# Patient Record
Sex: Female | Born: 1980 | Race: White | Hispanic: No | Marital: Married | Smoking: Never smoker
Health system: Southern US, Community
[De-identification: ages and names within clinical notes are randomized; demographics above are authoritative.]

## PROBLEM LIST (undated history)

## (undated) DIAGNOSIS — E785 Hyperlipidemia, unspecified: Secondary | ICD-10-CM

## (undated) DIAGNOSIS — K219 Gastro-esophageal reflux disease without esophagitis: Secondary | ICD-10-CM

## (undated) DIAGNOSIS — F32A Depression, unspecified: Secondary | ICD-10-CM

## (undated) DIAGNOSIS — R011 Cardiac murmur, unspecified: Secondary | ICD-10-CM

## (undated) DIAGNOSIS — T7840XA Allergy, unspecified, initial encounter: Secondary | ICD-10-CM

## (undated) DIAGNOSIS — E079 Disorder of thyroid, unspecified: Secondary | ICD-10-CM

## (undated) DIAGNOSIS — R5382 Chronic fatigue, unspecified: Secondary | ICD-10-CM

## (undated) DIAGNOSIS — F419 Anxiety disorder, unspecified: Secondary | ICD-10-CM

## (undated) DIAGNOSIS — F329 Major depressive disorder, single episode, unspecified: Secondary | ICD-10-CM

## (undated) HISTORY — DX: Disorder of thyroid, unspecified: E07.9

## (undated) HISTORY — DX: Allergy, unspecified, initial encounter: T78.40XA

## (undated) HISTORY — DX: Chronic fatigue, unspecified: R53.82

## (undated) HISTORY — DX: Cardiac murmur, unspecified: R01.1

## (undated) HISTORY — DX: Hyperlipidemia, unspecified: E78.5

## (undated) HISTORY — DX: Depression, unspecified: F32.A

## (undated) HISTORY — DX: Major depressive disorder, single episode, unspecified: F32.9

## (undated) HISTORY — DX: Anxiety disorder, unspecified: F41.9

---

## 2003-10-26 HISTORY — PX: TONSILLECTOMY AND ADENOIDECTOMY: SHX28

## 2013-01-11 ENCOUNTER — Telehealth: Payer: Self-pay | Admitting: Obstetrics and Gynecology

## 2013-01-11 MED ORDER — NORETHIN ACE-ETH ESTRAD-FE 1.5-30 MG-MCG PO TABS: 1.0000 | ORAL_TABLET | Freq: Every day | ORAL | Status: DC

## 2013-01-11 NOTE — Telephone Encounter (Signed)
BC REFILL SENT TO [facility], [place]. WITH NOTE FOR PATE INT TO MAKE ANNUAL GYN APPT. WITH DR. Tresa Res.

## 2013-01-23 ENCOUNTER — Other Ambulatory Visit: Payer: Self-pay | Admitting: Obstetrics and Gynecology

## 2013-01-23 NOTE — Telephone Encounter (Signed)
Pt has an annual exam scheduled for 02/12/2013 no refill needed At this time

## 2013-02-08 ENCOUNTER — Ambulatory Visit: Payer: Self-pay | Admitting: Nurse Practitioner

## 2013-02-12 ENCOUNTER — Encounter: Payer: Self-pay | Admitting: Obstetrics and Gynecology

## 2013-02-12 ENCOUNTER — Ambulatory Visit (INDEPENDENT_AMBULATORY_CARE_PROVIDER_SITE_OTHER): Payer: BC Managed Care – PPO | Admitting: Obstetrics and Gynecology

## 2013-02-12 ENCOUNTER — Ambulatory Visit: Payer: Self-pay | Admitting: Nurse Practitioner

## 2013-02-12 VITALS — BP 110/58 | Ht 66.75 in | Wt 137.0 lb

## 2013-02-12 DIAGNOSIS — Z0000 Encounter for general adult medical examination without abnormal findings: Secondary | ICD-10-CM

## 2013-02-12 DIAGNOSIS — Z01419 Encounter for gynecological examination (general) (routine) without abnormal findings: Secondary | ICD-10-CM

## 2013-02-12 LAB — POCT URINALYSIS DIPSTICK
Bilirubin, UA: NEGATIVE
Glucose, UA: NEGATIVE
Leukocytes, UA: NEGATIVE
Nitrite, UA: NEGATIVE
Urobilinogen, UA: NEGATIVE

## 2013-02-12 MED ORDER — NORETHIN ACE-ETH ESTRAD-FE 1.5-30 MG-MCG PO TABS: 1.0000 | ORAL_TABLET | Freq: Every day | ORAL | Status: DC

## 2013-02-12 NOTE — Patient Instructions (Addendum)

## 2013-02-12 NOTE — Progress Notes (Signed)
Patient ID: Kayla Figueroa, female   DOB: 10-21-1981, 32 y.o.   MRN: [id number] 32 y.o.  Married  Caucasian female   G0P0 here for annual exam.   Patient states a headache and fever.  States 101 degrees F last night.  No sick family members.  No nausea or vomiting.  No sore throat.  Taking ibuprophen.    No changes in life.  No recent travel.  No new sexual partners.   Some constipation. Satisfied with OCPs.  Wants a refill on OCPs.  No immediate plans for childbearing.    Patient's last menstrual period was 02/06/2013.          Sexually active: yes  The current method of family planning is OCP (estrogen/progesterone).    Exercising: cardio, weight training Last mammogram:  n/a Last pap smear: 02-07-12 wnl:neg HPV History of abnormal pap: no Smoking: no Alcohol: no Last colonoscopy: n/a Last Bone Density:  n/a Last tetanus shot: 03/2009 Last cholesterol check: unsure  Hgb: 14.9               Urine: neg    Health Maintenance  Topic Date Due  . Pap Smear  02/04/1999  . Tetanus/tdap  02/04/2000  . Influenza Vaccine  06/25/2013    Family History  Problem Relation Age of Onset  . Hyperlipidemia Father     There is no problem list on file for this patient.   Past Medical History  Diagnosis Date  . Anemia   . Depression   . Heart murmur     present in high school--no longer present    Past Surgical History  Procedure Laterality Date  . Tonsillectomy and adenoidectomy  2005    as an adult    Allergies: Sulfa antibiotics  Current Outpatient Prescriptions  Medication Sig Dispense Refill  . buPROPion (WELLBUTRIN SR) 100 MG 12 hr tablet Take 100 mg by mouth daily.      . calcium carbonate (OS-CAL) 600 MG TABS Take 600 mg by mouth 2 (two) times daily with a meal.      . clonazePAM (KLONOPIN) 2 MG tablet Take 2 mg by mouth 2 (two) times daily as needed for anxiety.      . DULoxetine (CYMBALTA) 60 MG capsule Take 60 mg by mouth daily.      . Multiple Vitamin  (MULTIVITAMIN) capsule Take 1 capsule by mouth daily.      . norethindrone-ethinyl estradiol-iron (MICROGESTIN FE,GILDESS FE,LOESTRIN FE) 1.5-30 MG-MCG tablet Take 1 tablet by mouth daily. NOTE TO PATIENT; NEEDS TO KEEP OR MAKE APPT. FOR ANNUAL GYN EXAM FOR FURTHER REFILLS. LAST OV 01/2012  1 Package  0  . QUEtiapine (SEROQUEL) 100 MG tablet Take 100 mg by mouth at bedtime.       No current facility-administered medications for this visit.    ROS: Pertinent items are noted in HPI.  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    Exam:    BP 110/58  Ht 5' 6.75" (1.695 m)  Wt 137 lb (62.143 kg)  BMI 21.63 kg/m2  LMP 02/06/2013   Wt Readings from Last 3 Encounters:  02/12/13 137 lb (62.143 kg)     Ht Readings from Last 3 Encounters:  02/12/13 5' 6.75" (1.695 m)    General appearance: alert, cooperative and appears stated age Head: Normocephalic, without obvious abnormality, atraumatic Neck: no adenopathy, supple, symmetrical, trachea midline and thyroid not  enlarged, symmetric, no tenderness/mass/nodules Lungs: clear to auscultation bilaterally Breasts: Inspection negative, No nipple retraction or dimpling, No nipple discharge or bleeding, No axillary or supraclavicular adenopathy, Normal to palpation without dominant masses Heart: regular rate and rhythm Abdomen: soft, non-tender; bowel sounds normal; no masses,  no organomegaly Extremities: extremities normal, atraumatic, no cyanosis or edema Skin: Skin color, texture, turgor normal. No rashes or lesions Lymph nodes: Cervical, supraclavicular, and axillary nodes normal. No abnormal inguinal nodes palpated Neurologic: Grossly normal   Pelvic: External genitalia:  no lesions              Urethra:  normal appearing urethra with no masses, tenderness or lesions              Bartholins and Skenes: normal                 Vagina: normal appearing vagina with normal  color and discharge, no lesions              Cervix: normal appearance              Pap taken: no        Bimanual Exam:  Uterus:  uterus is normal size, shape, consistency and nontender                                      Adnexa: normal adnexa in size, nontender and no masses                                      Rectovaginal: Confirms                                      Anus:  normal sphincter tone, no lesions  A: normal gyn exam Probable viral syndrome     P:  Continue OCPs. See PCP if fever and malaise don't resolve. return annually or prn     An After Visit Summary was printed and given to the patient.

## 2013-02-13 LAB — HEMOGLOBIN, FINGERSTICK: Hemoglobin, fingerstick: 14.9 g/dL (ref 12.0–16.0)

## 2013-03-06 ENCOUNTER — Ambulatory Visit: Payer: Self-pay | Admitting: Family Medicine

## 2013-04-04 ENCOUNTER — Encounter: Payer: Self-pay | Admitting: Internal Medicine

## 2013-04-04 ENCOUNTER — Ambulatory Visit (INDEPENDENT_AMBULATORY_CARE_PROVIDER_SITE_OTHER): Payer: BC Managed Care – PPO | Admitting: Internal Medicine

## 2013-04-04 VITALS — BP 114/81 | HR 105 | Temp 99.1°F | Ht 67.0 in | Wt 151.8 lb

## 2013-04-04 DIAGNOSIS — R51 Headache: Secondary | ICD-10-CM | POA: Insufficient documentation

## 2013-04-04 DIAGNOSIS — R5381 Other malaise: Secondary | ICD-10-CM

## 2013-04-04 DIAGNOSIS — F32A Depression, unspecified: Secondary | ICD-10-CM

## 2013-04-04 DIAGNOSIS — F329 Major depressive disorder, single episode, unspecified: Secondary | ICD-10-CM

## 2013-04-04 DIAGNOSIS — R21 Rash and other nonspecific skin eruption: Secondary | ICD-10-CM | POA: Insufficient documentation

## 2013-04-04 DIAGNOSIS — F411 Generalized anxiety disorder: Secondary | ICD-10-CM

## 2013-04-04 DIAGNOSIS — R52 Pain, unspecified: Secondary | ICD-10-CM | POA: Insufficient documentation

## 2013-04-04 DIAGNOSIS — R509 Fever, unspecified: Secondary | ICD-10-CM

## 2013-04-04 DIAGNOSIS — R5383 Other fatigue: Secondary | ICD-10-CM | POA: Insufficient documentation

## 2013-04-04 DIAGNOSIS — F419 Anxiety disorder, unspecified: Secondary | ICD-10-CM

## 2013-04-04 DIAGNOSIS — R519 Headache, unspecified: Secondary | ICD-10-CM | POA: Insufficient documentation

## 2013-04-04 DIAGNOSIS — R635 Abnormal weight gain: Secondary | ICD-10-CM

## 2013-04-04 LAB — CBC WITH DIFFERENTIAL/PLATELET
Eosinophils Relative: 4 % (ref 0–5)
HCT: 43.9 % (ref 36.0–46.0)
Lymphocytes Relative: 38 % (ref 12–46)
Lymphs Abs: 1.8 10*3/uL (ref 0.7–4.0)
MCV: 86.6 fL (ref 78.0–100.0)
Monocytes Absolute: 0.4 10*3/uL (ref 0.1–1.0)
RBC: 5.07 MIL/uL (ref 3.87–5.11)
RDW: 13.3 % (ref 11.5–15.5)
WBC: 4.8 10*3/uL (ref 4.0–10.5)

## 2013-04-04 LAB — COMPREHENSIVE METABOLIC PANEL
ALT: 13 U/L (ref 0–35)
CO2: 28 mEq/L (ref 19–32)
Creat: 0.9 mg/dL (ref 0.50–1.10)
Total Bilirubin: 0.5 mg/dL (ref 0.3–1.2)

## 2013-04-04 NOTE — Progress Notes (Signed)
Patient ID: Kayla Figueroa, female   DOB: [birth year], 32 y.o.   MRN: [id number]         [facility] for Infectious Disease  Reason for Consult: Persistent low grade fevers of unknown origin and fatigue Referring Physician: Dr. Nira Conn  Patient Active Problem List   Diagnosis Date Noted  . Fatigue 04/04/2013    Priority: High  . Fever of unknown origin (FUO) 04/04/2013    Priority: High  . Headache(784.0) 04/04/2013    Priority: Medium  . Maculopapular rash, localized 04/04/2013    Priority: Medium  . Body aches 04/04/2013    Priority: Medium  . Weight gain 04/04/2013  . Anxiety 04/04/2013  . Depression 04/04/2013    Patient's Medications  New Prescriptions   No medications on file  Previous Medications   ASCORBIC ACID (VITAMIN C) 100 MG TABLET    Take 100 mg by mouth daily.   B COMPLEX VITAMINS TABLET    Take 1 tablet by mouth daily.   BUPROPION (WELLBUTRIN SR) 100 MG 12 HR TABLET    Take 100 mg by mouth daily.   CALCIUM CARBONATE (OS-CAL) 600 MG TABS    Take 600 mg by mouth 2 (two) times daily with a meal.   CLONAZEPAM (KLONOPIN) 2 MG TABLET    Take 2 mg by mouth 2 (two) times daily as needed for anxiety.   DULOXETINE (CYMBALTA) 60 MG CAPSULE    Take 60 mg by mouth daily.   MULTIPLE VITAMIN (MULTIVITAMIN) CAPSULE    Take 1 capsule by mouth daily.   NORETHINDRONE-ETHINYL ESTRADIOL-IRON (MICROGESTIN FE,GILDESS FE,LOESTRIN FE) 1.5-30 MG-MCG TABLET    Take 1 tablet by mouth daily. NOTE TO PATIENT; NEEDS TO KEEP OR MAKE APPT. FOR ANNUAL GYN EXAM FOR FURTHER REFILLS. LAST OV 01/2012   QUETIAPINE (SEROQUEL) 100 MG TABLET    Take 100 mg by mouth at bedtime.  Modified Medications   No medications on file  Discontinued Medications   No medications on file    Recommendations: 1. Repeat CBC with differential, and obtain any complete metabolic panel and TSH 2. I instructed her to maintain a simple, daily journal 3. Continue observation off of antibiotics 4. I gave  her written information about chronic fatigue 5. Followup in one week   Assessment: Ms. Lynam is bothered by extreme persistent fatigue following an acute febrile illness in late April. She is somewhat obsessive week checking her temperature. She notes that she is a Chiropodist and, by nature, wants to figure things out. The range of temperature she has been recording may be within her normal range and I am not absolutely certain that this is a true fever of unknown origin. I will check chemistries including liver enzymes today. It is possible that she has a mononucleosis syndrome caused by cytomegalovirus or Epstein-Barr virus (even with a negative Monospot). I will also at check a TSH.  She may have a post infectious syndrome or possibly the early stages of chronic fatigue syndrome. I talked to her about the possibility that some of her illness could be related to side effects of her psychotropic medications but she does not see a temporal association. All of her medications had been on stable dosing for several months before her illness began. In talking with her and her husband it also does not appear that she is currently suffering from significant anxiety or depression.  I did suggest that she talk to her psychiatrist and therapist about what she is going through. Specific counseling,  including cognitive behavioral therapy, has been helpful in some studies of chronic fatigue. I will plan on seeing her back in one week.  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 Lahoma Rocker for evaluation of persistent low-grade fever and fatigue. She was very good health with the exception of chronic anxiety and mild depression which she has had since entering graduate school. She has been under the care of a psychiatrist and therapist and was started on Wellbutrin, Klonopin, Seroquel and Cymbalta early this year. She also  started strenuous physical exercise and with the combination of medical therapy, counseling and exercise had noted that her anxiety and depression were markedly improved.   She was feeling very well until around April 19 when she had the sudden onset of fever up to 102 associated with intermittent headaches and body aches. She had a few episodes where she was freezing cold and had sweats. After a few days her fever went down and has been in the 99-100.5 range consistently each day since. She takes her temperature on a very frequent basis sometimes up to 5 times an hour. She has continued to have intermittent headaches and body aches and has suffered from extreme fatigue. After several weeks she had a transient rash in her antecubital fossae. She described it as raised white bumps surrounded by red that would blanch with pressure. The rash was mildly pruritic. It resolved spontaneously after her one week. She was seen by Dr. Lahoma Rocker on April 25 and on several occasions following that. She has had a very extensive evaluation that has all been nondiagnostic including serial CBCs, urinalysis, sedimentation rate, LDH, CPK, HIV 2 antibody, rheumatoid factor, ANA, histoplasma antigen, GC, chlamydia, Monospot, PPD, blood culture times one, malaria smear, stool for ova and parasites, chest x-ray, and head CT. I do not see that she's had any chemistries obtained. She was treated with a ten-day course of doxycycline starting on April 25 but did not note any improvement.  She says that she is feeling better than she did in the first few weeks of her illness but seems to have plateaued. Fatigue is her #1 complaint and she notes that it gets worse when she tries to be more active. She has continued to work but generally takes Mondays off because it takes a long weekend to recover. She is also given up her strenuous workouts. Yesterday she walked 3-1/2 miles very slowly with her husband and feels much more exhausted today.  She has noted that she craves sweets and she has gained about 14 pounds since her illness began. She attributes this to not working out. She has no trouble sleeping and feels like she sleeps all of the time. She falls asleep easily and stays asleep throughout the night and frequently naps during the day. Her husband, Kathlene November, who is with her today does not note any fitful sleeping that is unusual.  She has traveled to [place] on 3 occasions for her work the last being in October of 2012. She had a brief febrile illness with headache, body aches and sore throat when last in [place] and this resolved with some antibiotic she was given after several days. She has had no other recent international travel. She has had no sick contacts that she knows of. She has 2 healthy adult cats. She and her husband got a dog after her illness began. She has no known tick  exposure. She has never had a blood transfusion.  Review of Systems: Constitutional: positive for fatigue, fevers and sweats, negative for anorexia, chills and weight loss Eyes: negative Ears, nose, mouth, throat, and face: negative Respiratory: negative Cardiovascular: negative Gastrointestinal: positive for constipation Genitourinary:negative Integument/breast: positive for rash Hematologic/lymphatic: negative for easy bruising and lymphadenopathy Musculoskeletal:positive for arthralgias and myalgias, negative for back pain, muscle weakness, neck pain and stiff joints Neurological: positive for headaches and memory problems, negative for coordination problems, dizziness, gait problems, seizures, speech problems and weakness Behavioral/Psych: positive for anxiety, depression and sleep disturbance, negative for abusive relationship, decreased appetite, excessive alcohol consumption, illegal drug usage, increased appetite, loss of interest in favorite activities, mood swings and tobacco use    Past Medical History  Diagnosis Date  . Anemia   .  Depression   . Heart murmur     present in high school--no longer present  . Allergy     History  Substance Use Topics  . Smoking status: Never Smoker   . Smokeless tobacco: Not on file  . Alcohol Use: No    Family History  Problem Relation Age of Onset  . Hyperlipidemia Father    Allergies  Allergen Reactions  . Sulfa Antibiotics Hives    OBJECTIVE: Blood pressure 114/81, pulse 105, temperature 99.1 F (37.3 C), height 5\' 7"  (1.702 m), weight 151 lb 12 oz (68.833 kg). General: She appears slightly worried but otherwise in good spirits and in no distress Eyes: Normal external exam Oral: No oropharyngeal lesion and teeth in good condition Lymph nodes: No palpable adenopathy Skin: No rash at this time. No splinter or conjunctival hemorrhages Neck: Supple Lungs: Clear Cor: Regular S1 and S2 no murmurs Abdomen: Soft and nontender with no palpable liver, spleen or other masses. Normal bowel sounds Joints and extremities: Normal  Neurologic: Alert and fully oriented with normal speech Mood and affect: Normal  Microbiology: No results found for this or any previous visit (from the past 240 hour(s)).  Cliffton Asters, MD [facility] for Infectious Disease [address] [facility] [phone] pager   [phone] cell 04/04/2013, 3:49 PM

## 2013-04-11 ENCOUNTER — Ambulatory Visit (INDEPENDENT_AMBULATORY_CARE_PROVIDER_SITE_OTHER): Payer: BC Managed Care – PPO | Admitting: Internal Medicine

## 2013-04-11 ENCOUNTER — Encounter: Payer: Self-pay | Admitting: Internal Medicine

## 2013-04-11 VITALS — BP 110/77 | HR 91 | Temp 98.9°F | Ht 67.0 in | Wt 154.5 lb

## 2013-04-11 DIAGNOSIS — R5381 Other malaise: Secondary | ICD-10-CM

## 2013-04-11 DIAGNOSIS — R5383 Other fatigue: Secondary | ICD-10-CM

## 2013-04-11 NOTE — Progress Notes (Signed)
Patient ID: Kayla Figueroa, female   DOB: 1981-01-24, 32 y.o.   MRN: [id number]         [facility] for Infectious Disease  Patient Active Problem List   Diagnosis Date Noted  . Fatigue 04/04/2013    Priority: High  . Fever of unknown origin (FUO) 04/04/2013    Priority: High  . Headache(784.0) 04/04/2013    Priority: Medium  . Maculopapular rash, localized 04/04/2013    Priority: Medium  . Body aches 04/04/2013    Priority: Medium  . Weight gain 04/04/2013  . Anxiety 04/04/2013  . Depression 04/04/2013    Patient's Medications  New Prescriptions   No medications on file  Previous Medications   ASCORBIC ACID (VITAMIN C) 100 MG TABLET    Take 100 mg by mouth daily.   B COMPLEX VITAMINS TABLET    Take 1 tablet by mouth daily.   BUPROPION (WELLBUTRIN SR) 100 MG 12 HR TABLET    Take 100 mg by mouth daily.   CALCIUM CARBONATE (OS-CAL) 600 MG TABS    Take 600 mg by mouth 2 (two) times daily with a meal.   CLONAZEPAM (KLONOPIN) 2 MG TABLET    Take 2 mg by mouth 2 (two) times daily as needed for anxiety.   DULOXETINE (CYMBALTA) 60 MG CAPSULE    Take 60 mg by mouth daily.   MULTIPLE VITAMIN (MULTIVITAMIN) CAPSULE    Take 1 capsule by mouth daily.   NORETHINDRONE-ETHINYL ESTRADIOL-IRON (MICROGESTIN FE,GILDESS FE,LOESTRIN FE) 1.5-30 MG-MCG TABLET    Take 1 tablet by mouth daily. NOTE TO PATIENT; NEEDS TO KEEP OR MAKE APPT. FOR ANNUAL GYN EXAM FOR FURTHER REFILLS. LAST OV 01/2012   QUETIAPINE (SEROQUEL) 100 MG TABLET    Take 100 mg by mouth at bedtime.  Modified Medications   No medications on file  Discontinued Medications   No medications on file    Subjective: Kayla Figueroa is in today with her husband for her one-week followup visit. She says that she feels a little bit worse recently. She remains extremely fatigued and feels like her headaches are more frequent. She cannot find anything that helps with the headaches so she does not take anything for them. She had family visiting  from out of town this past weekend and feels like that wore her out. Yesterday she came home and went to bed at 8:30 and slept for 12 one half hours. She feels like she sleeps well but she does not wake feeling rested. She has been taking her temperature a regular basis as I asked her to. Over the past week her temperatures have ranged from a low of 99 to a high of 99.8.  Review of Systems: Constitutional: positive for fatigue, sweats and weight gain, negative for anorexia, chills and weight loss Eyes: negative for irritation, redness and visual disturbance Ears, nose, mouth, throat, and face: negative Respiratory: negative for cough, dyspnea on exertion and pleurisy/chest pain Cardiovascular: negative for chest pain, chest pressure/discomfort, irregular heart beat and palpitations Gastrointestinal: positive for nausea, negative for abdominal pain, change in bowel habits, constipation, diarrhea, reflux symptoms and vomiting Genitourinary:negative Behavioral/Psych: positive for anxiety and depression, negative for excessive alcohol consumption, illegal drug usage and loss of interest in favorite activities  Past Medical History  Diagnosis Date  . Anemia   . Depression   . Heart murmur     present in high school--no longer present  . Allergy     History  Substance Use Topics  . Smoking status:  Never Smoker   . Smokeless tobacco: Not on file  . Alcohol Use: No    Family History  Problem Relation Age of Onset  . Hyperlipidemia Father     Allergies  Allergen Reactions  . Sulfa Antibiotics Hives    Objective: Temp: 98.9 F (37.2 C) (06/18 1551) Temp src: Oral (06/18 1551) BP: 110/77 mmHg (06/18 1551) Pulse Rate: 91 (06/18 1551)  General: She appears a little tired but is in no distress. Oral: No oropharyngeal lesions. Teeth are in good condition Eyes: Normal external exam Lymph nodes: No palpable adenopathy Skin: No rash Lungs: Clear Cor: Regular S1 and S2 no  murmurs Abdomen: Soft and nontender. No masses are palpable Joints and extremities: Normal Neurologic: Alert and fully oriented with normal conversation Mood and affect: Normal  Lab Results  Component Value Date   WBC 4.8 04/04/2013   HGB 14.6 04/04/2013   HCT 43.9 04/04/2013   MCV 86.6 04/04/2013   PLT 241 04/04/2013   BMET    Component Value Date/Time   NA 139 04/04/2013 1547   K 4.5 04/04/2013 1547   CL 103 04/04/2013 1547   CO2 28 04/04/2013 1547   GLUCOSE 85 04/04/2013 1547   BUN 12 04/04/2013 1547   CREATININE 0.90 04/04/2013 1547   CALCIUM 8.8 04/04/2013 1547   Lab Results  Component Value Date   TSH 2.383 04/04/2013    Assessment: Kayla Figueroa has developed severe chronic fatigue and headaches following a transient acute febrile illness in April. Initially it was felt that she had a persistent fever of unknown origin but the temperatures she has been recording her well within her expected normal temperature range. She's had a fairly extensive workup over the last 6 weeks which has been remarkably normal. I cannot find any organic basis for her fatigue. 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 She stated that she appreciated having the written information about chronic fatigue syndrome and did find that it seemed extremely similar to what she is experiencing.  At this point I do not think further diagnostic testing will be helpful. I would like to shift the focus from trying to find a specific cause to helping her learn to live with her most bothersome symptoms while they hopefully began to burn himself out.  She is very driven and somewhat competitive individual who is very use to be able to do things for herself and others. She thrived on very strenuous exercise which she has been unable to do since this illness began. Today she does admit that been unable to exercise and gaining weight recently he has been somewhat depressing for her. She does not believe that  her medications for anxiety are contributing to her fatigue but does wonder if they could be contributing to her weight gain.  I've suggested that she review all of what she is going through with her psychiatrist, Dr. Stevphen Rochester Su. I think it would be best to have him thoroughly review her situation and determine if any of her medications need to be changed. There is also some published reports suggesting that cognitive behavioral therapy can be beneficial for some people suffering from chronic fatigue syndrome. I suggested that she try some less strenuous exercise such as yoga and walking.  Plan: 1. She will review her current situation with Dr. Janeece Riggers at her upcoming visit 2. Followup here in 4-6 weeks   Cliffton Asters, MD [facility] for Infectious Disease [facility] [phone] pager  [phone] cell 04/11/2013, 5:50 PM

## 2013-05-08 ENCOUNTER — Telehealth: Payer: Self-pay | Admitting: *Deleted

## 2013-05-08 NOTE — Telephone Encounter (Signed)
Patient called c/o worsening headaches and wanted advise on what she could take for this. Spoke with Dr. Orvan Falconer and he advised her to contact her PCP, advised patient of this. Wendall Mola

## 2013-05-28 ENCOUNTER — Ambulatory Visit: Payer: BC Managed Care – PPO | Admitting: Internal Medicine

## 2013-08-30 ENCOUNTER — Other Ambulatory Visit: Payer: Self-pay

## 2013-11-24 IMAGING — CR DG CHEST 2V
1 series · 2 of 2 positions shown · non-contrast
Comparison: none

REASON FOR EXAM: fever
COMMENTS:

PROCEDURE:     MARETH - MARETH CHEST PA (OR AP) AND LAT  - March 06, 2013  [DATE]
RESULT:     The lungs are clear. The heart and pulmonary vessels are normal.
The bony and mediastinal structures are unremarkable. There is no effusion.
There is no pneumothorax or evidence of congestive failure.

[Series 1: pa · 0.17mm/px · 2 of 2 slices shown]
[im 1/2]
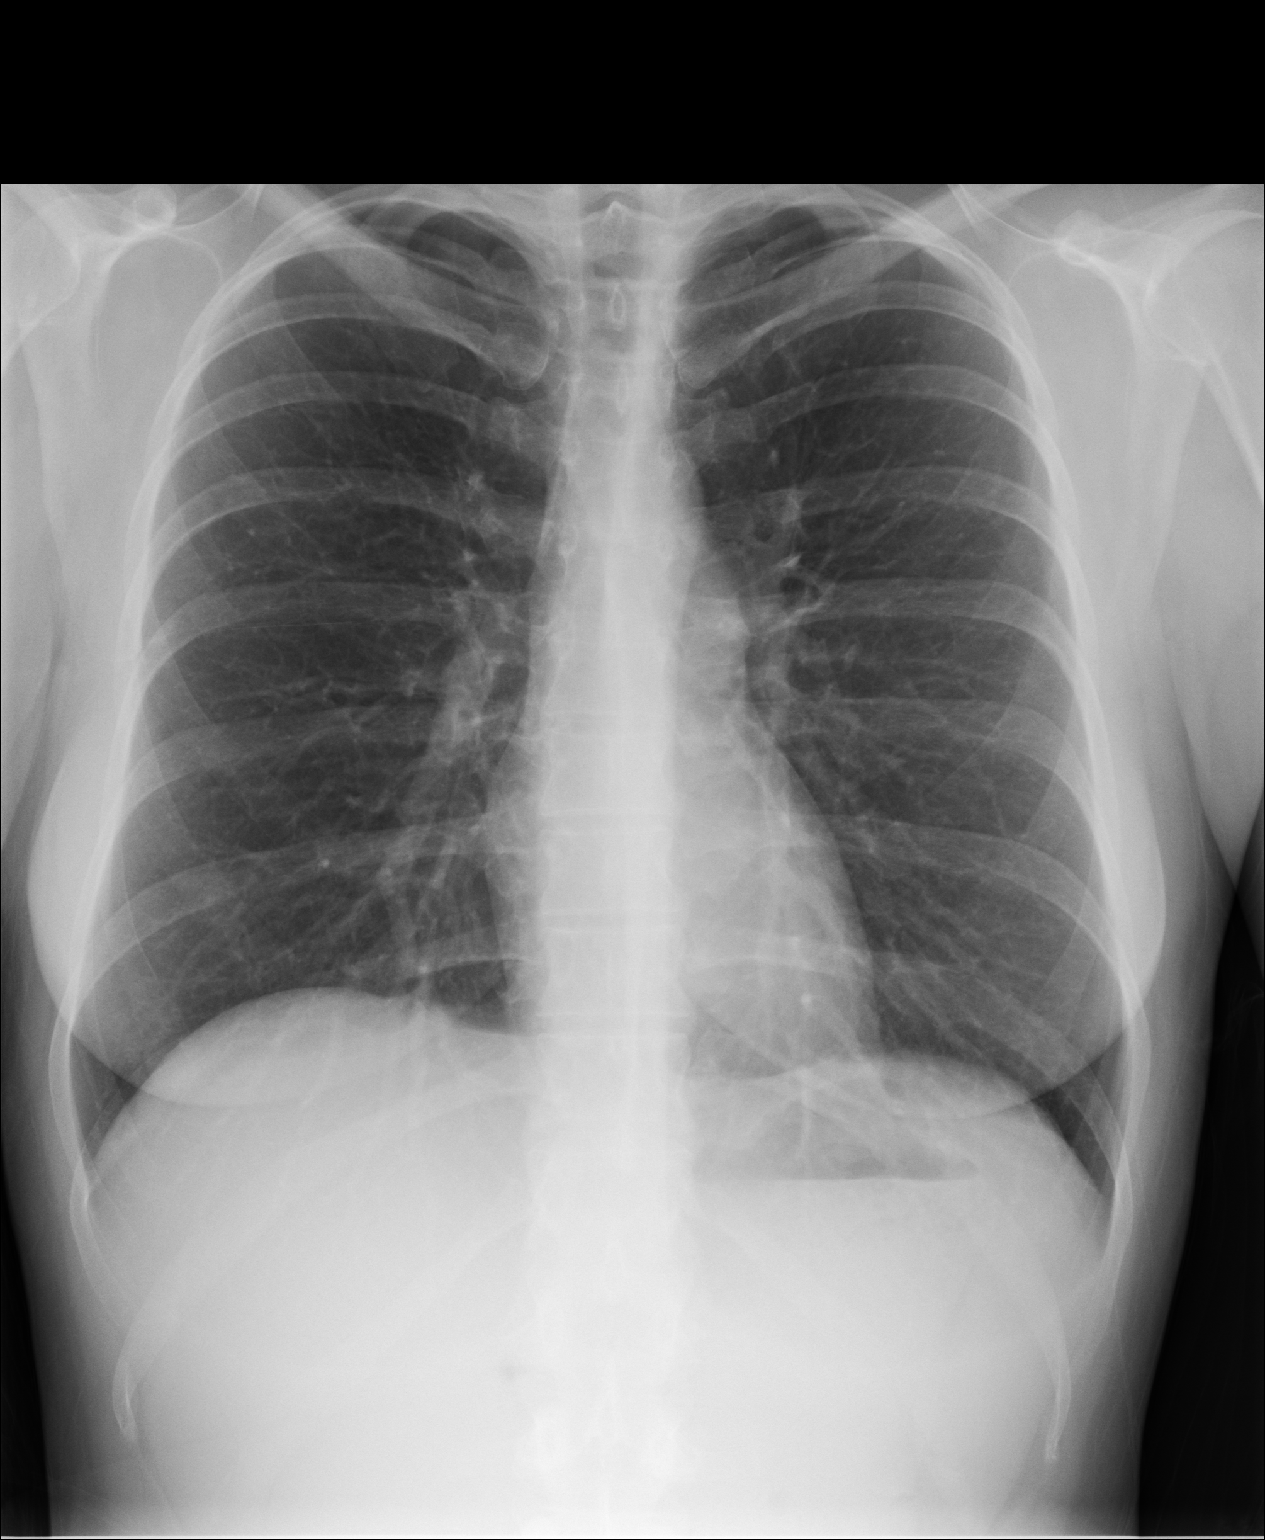
[im 2/2]
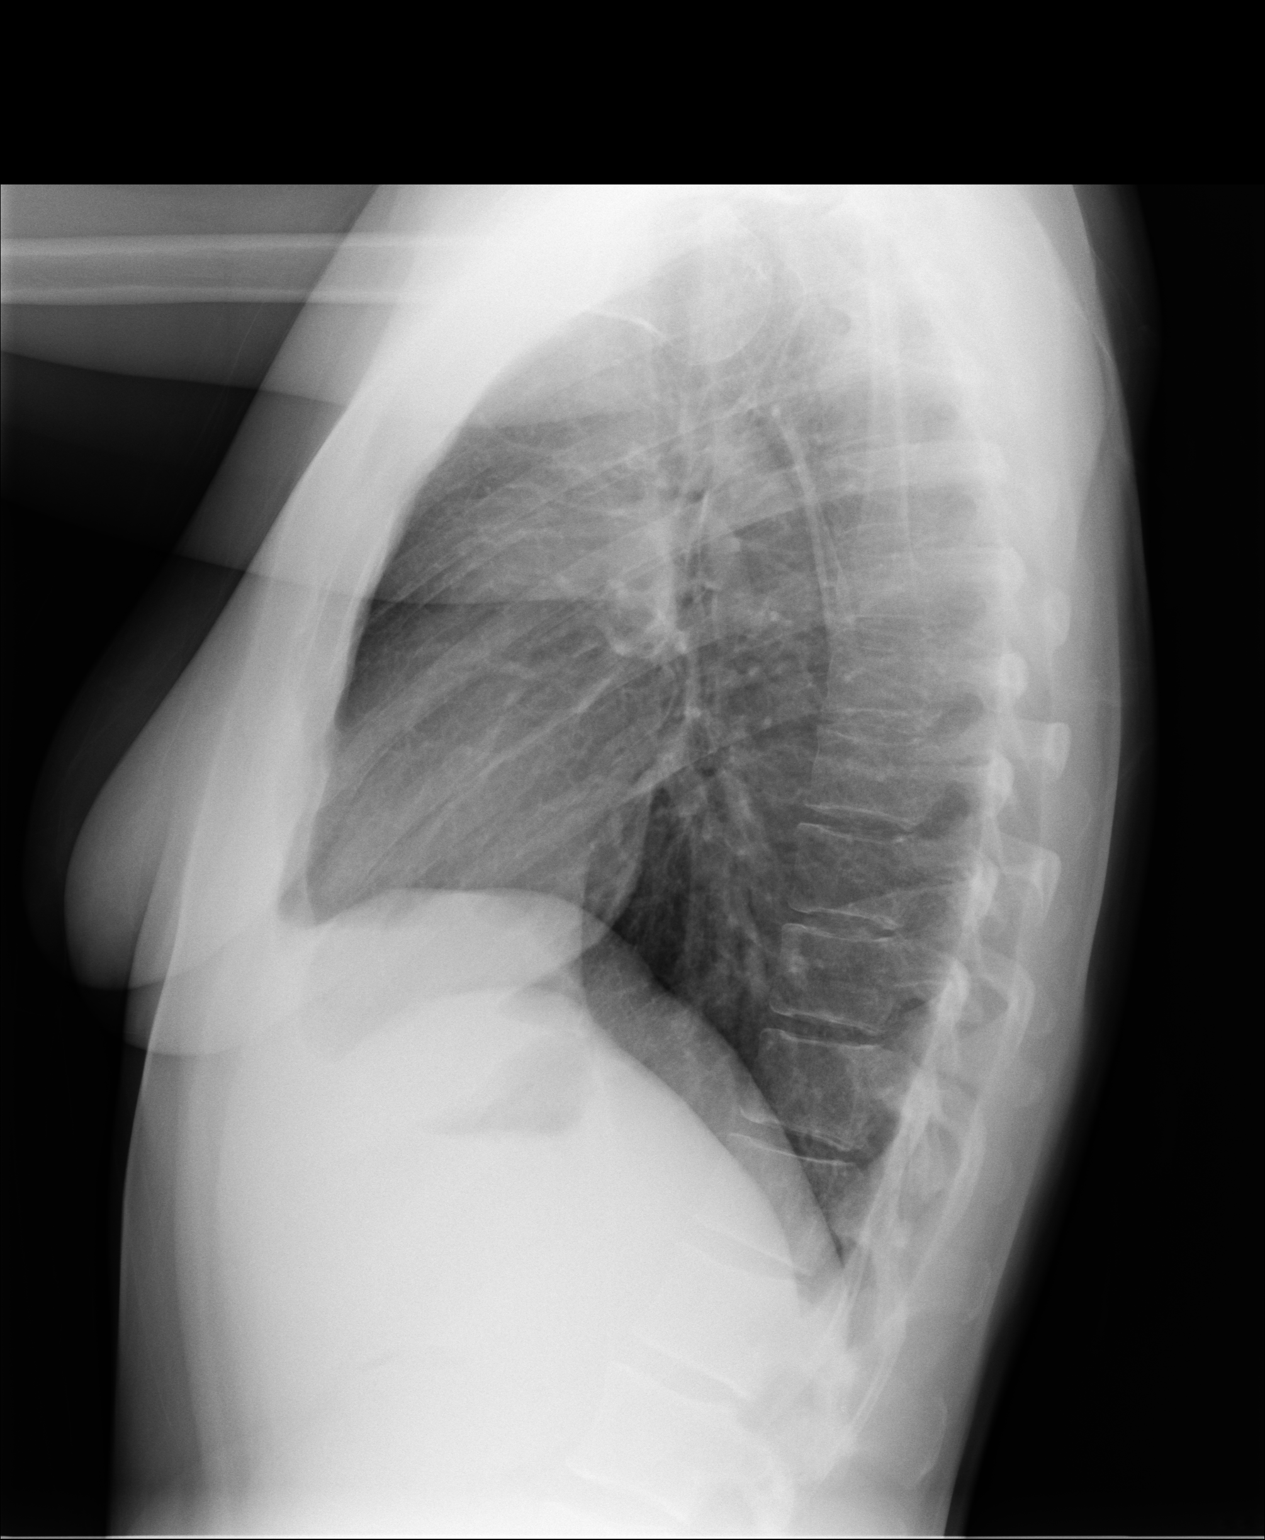

[2 of 2 positions shown; findings below may reference images not displayed]

IMPRESSION: No acute cardiopulmonary disease.

[REDACTED]

## 2014-01-10 ENCOUNTER — Other Ambulatory Visit: Payer: Self-pay | Admitting: *Deleted

## 2014-01-10 DIAGNOSIS — Z01419 Encounter for gynecological examination (general) (routine) without abnormal findings: Secondary | ICD-10-CM

## 2014-01-10 MED ORDER — NORETHIN ACE-ETH ESTRAD-FE 1.5-30 MG-MCG PO TABS: 1.0000 | ORAL_TABLET | Freq: Every day | ORAL | Status: DC

## 2014-01-10 NOTE — Telephone Encounter (Signed)
Last AEX and refill 02/12/2013 #3packs/3 refills Next appt 02/14/2014  Will refill one month until appt.

## 2014-02-13 ENCOUNTER — Ambulatory Visit: Payer: Self-pay | Admitting: Obstetrics and Gynecology

## 2014-02-14 ENCOUNTER — Encounter: Payer: Self-pay | Admitting: Obstetrics and Gynecology

## 2014-02-14 ENCOUNTER — Ambulatory Visit (INDEPENDENT_AMBULATORY_CARE_PROVIDER_SITE_OTHER): Payer: BC Managed Care – PPO | Admitting: Obstetrics and Gynecology

## 2014-02-14 VITALS — BP 120/82 | HR 76 | Ht 67.0 in | Wt 162.2 lb

## 2014-02-14 DIAGNOSIS — Z01419 Encounter for gynecological examination (general) (routine) without abnormal findings: Secondary | ICD-10-CM

## 2014-02-14 DIAGNOSIS — Z3169 Encounter for other general counseling and advice on procreation: Secondary | ICD-10-CM

## 2014-02-14 MED ORDER — NORETHIN ACE-ETH ESTRAD-FE 1.5-30 MG-MCG PO TABS: 1.0000 | ORAL_TABLET | Freq: Every day | ORAL | Status: DC

## 2014-02-14 NOTE — Progress Notes (Signed)
Patient ID: Kayla Figueroa, female   DOB: 1981-09-18, 33 y.o.   MRN: [id number] GYNECOLOGY VISIT  PCP: Dr. Lahoma RockerPancaldo Cheree Ditto([place], [place])  Referring provider:   HPI: 33 y.o.   Married  Caucasian  female   G0P0 with Patient's last menstrual period was 02/06/2014.   here for   AEX.  Menses normal on OCPs.   Considering childbearing.  Taking Cymbalta and Wellbutrin.  Stopped Klonopin.  [place] Janeece [place].   Had fatigue issues last summer - resolved.  Had negative evaluation with infectious disease provider.  Hgb:    PCP Urine:  no  GYNECOLOGIC HISTORY: Patient's last menstrual period was 02/06/2014. Sexually active:  yes Partner preference: female Contraception:  OCP's--Junel  Menopausal hormone therapy: n/a DES exposure:  no  Blood transfusions:   no Sexually transmitted diseases:   no GYN procedures and prior surgeries:  no Last mammogram:  n/a               Last pap and high risk HPV testing:   02-07-12 wnl:neg HR HPV. History of abnormal pap smear:  no   OB History   Grav Para Term Preterm Abortions TAB SAB Ect Mult Living   0                LIFESTYLE: Exercise:  Walking and exercise videos             Tobacco:   no Alcohol:     no Drug use:  no  OTHER HEALTH MAINTENANCE: Tetanus/TDap:   2009 Gardisil:              n/a Influenza:            07/2013 Zostavax:             n/a  Bone density:      n/a Colonoscopy:      n/a  Cholesterol check:  never  Family History  Problem Relation Age of Onset  . Hyperlipidemia Father   . Colon polyps Father   . Diverticulosis Father     has had bowel resection for diverticulitis  . Osteoporosis Mother   . Osteoporosis Maternal Grandmother     Patient Active Problem List   Diagnosis Date Noted  . Fatigue 04/04/2013  . Headache(784.0) 04/04/2013  . Weight gain 04/04/2013  . Maculopapular rash, localized 04/04/2013  . Body aches 04/04/2013  . Anxiety 04/04/2013  . Depression 04/04/2013   Past Medical History   Diagnosis Date  . Depression   . Heart murmur     present in high school--no longer present  . Allergy   . Anxiety   . Chronic fatigue     Past Surgical History  Procedure Laterality Date  . Tonsillectomy and adenoidectomy  2005    as an adult    ALLERGIES: Sulfa antibiotics  Current Outpatient Prescriptions  Medication Sig Dispense Refill  . buPROPion (WELLBUTRIN SR) 100 MG 12 hr tablet Take 100 mg by mouth daily.      . calcium carbonate (OS-CAL) 600 MG TABS Take 600 mg by mouth 2 (two) times daily with a meal.      . DULoxetine (CYMBALTA) 60 MG capsule Take 60 mg by mouth daily.      . Flaxseed, Linseed, (FLAX SEED OIL PO) Take 1 applicator by mouth daily.      Marland Kitchen. lactobacillus acidophilus (BACID) TABS tablet Take 2 tablets by mouth as needed.      . Multiple Vitamin (MULTIVITAMIN) capsule Take 1 capsule  by mouth daily.      . norethindrone-ethinyl estradiol-iron (MICROGESTIN FE,GILDESS FE,LOESTRIN FE) 1.5-30 MG-MCG tablet Take 1 tablet by mouth daily.  1 Package  0   No current facility-administered medications for this visit.     ROS:  Pertinent items are noted in HPI.  SOCIAL HISTORY:  Patient is a Psychologist, counsellingmicrobiologist.   PHYSICAL EXAMINATION:    BP 120/82  Pulse 76  Ht 5\' 7"  (1.702 m)  Wt 162 lb 3.2 oz (73.573 kg)  BMI 25.40 kg/m2  LMP 02/06/2014   Wt Readings from Last 3 Encounters:  02/14/14 162 lb 3.2 oz (73.573 kg)  04/11/13 154 lb 8 oz (70.081 kg)  04/04/13 151 lb 12 oz (68.833 kg)     Ht Readings from Last 3 Encounters:  02/14/14 5\' 7"  (1.702 m)  04/11/13 5\' 7"  (1.702 m)  04/04/13 5\' 7"  (1.702 m)    General appearance: alert, cooperative and appears stated age Head: Normocephalic, without obvious abnormality, atraumatic Neck: no adenopathy, supple, symmetrical, trachea midline and thyroid not enlarged, symmetric, no tenderness/mass/nodules Lungs: clear to auscultation bilaterally Breasts: Inspection negative, No nipple retraction or dimpling, No nipple  discharge or bleeding, No axillary or supraclavicular adenopathy, Normal to palpation without dominant masses Heart: regular rate and rhythm Abdomen: soft, non-tender; no masses,  no organomegaly Extremities: extremities normal, atraumatic, no cyanosis or edema Skin: Skin color, texture, turgor normal. No rashes or lesions Lymph nodes: Cervical, supraclavicular, and axillary nodes normal. No abnormal inguinal nodes palpated Neurologic: Grossly normal  Pelvic: External genitalia:  no lesions              Urethra:  normal appearing urethra with no masses, tenderness or lesions              Bartholins and Skenes: normal                 Vagina: normal appearing vagina with normal color and discharge, no lesions              Cervix: normal appearance              Pap and high risk HPV testing done: no.            Bimanual Exam:  Uterus:  uterus is normal size, shape, consistency and nontender                                      Adnexa: normal adnexa in size, nontender and no masses                                        ASSESSMENT  Normal gynecologic exam. Desire for future pregnancy.  Depression and anxiety.   On Cymbalta and Wellbutrin.   PLAN  Mammogram recommended yearly.  Pap smear and high risk HPV testing Prenatal counseling.  Start PNV with DHA.  I recommended reading "what to Expect When Trying for Pregnancy." Refilled OCPs for one year in case delays pregnancy plans.  I discussed possible effects of antidepressants in pregnancy.  Will need to be reviewed with her psychiatrist and obstetrician.  Check rubella titer today.  Return annually or prn   An After Visit Summary was printed and given to the patient.

## 2014-02-14 NOTE — Patient Instructions (Signed)

## 2014-02-19 LAB — RUBELLA ANTIBODY, IGM: RUBELLA: 0.42 (ref <0.90)

## 2014-02-21 ENCOUNTER — Telehealth: Payer: Self-pay | Admitting: Obstetrics and Gynecology

## 2014-02-21 ENCOUNTER — Telehealth: Payer: Self-pay

## 2014-02-21 NOTE — Telephone Encounter (Signed)
Patient said Quincy Simmonds recommened  The MMR vaccine. She told her to [facility] but she doesn't have time to do so [facility] but they told her she needs a prescription for it  [facility] on [address] in [place]

## 2014-02-21 NOTE — Telephone Encounter (Signed)
Message copied by Alphonsa OverallIXON, Qualyn Oyervides L on Thu Feb 21, 2014 10:43 AM ------      Message from: AMUNDSON DE Gwenevere GhaziARVALHO E SILVA, BROOK E      Created: Wed Feb 20, 2014  9:23 AM       Results to patient through My Chart.       Needs Rubella booster and avoidance of pregnancy for one month following [facility].             Please place call in to patient to be sure she reads the My Chart message.             ------

## 2014-02-21 NOTE — Telephone Encounter (Signed)
Routing to provider for final review. Patient agreeable to disposition. Will close encounter.     

## 2014-02-21 NOTE — Telephone Encounter (Signed)
Called pt. To verify she had received lab results through My Chart.  Asked pt. To call me if she has not and I will discuss them with her.

## 2014-02-21 NOTE — Telephone Encounter (Signed)
Dr.Silva paper prescription on your desk for review and signature. Will have patient pick prescription up.

## 2014-02-21 NOTE — Telephone Encounter (Signed)
Spoke with patient and advised Dr. Quincy Simmonds has signed Rx for MMR vaccine and she can pick up.  Patient states she lives over 1 hour away, can we mail to her?  Mailed Rx for MMR vaccing to patient.

## 2014-02-22 ENCOUNTER — Ambulatory Visit: Payer: Self-pay | Admitting: Nurse Practitioner

## 2015-03-28 ENCOUNTER — Encounter: Payer: Self-pay | Admitting: Family Medicine

## 2015-03-28 ENCOUNTER — Ambulatory Visit (INDEPENDENT_AMBULATORY_CARE_PROVIDER_SITE_OTHER): Payer: BC Managed Care – PPO | Admitting: Family Medicine

## 2015-03-28 VITALS — BP 100/71 | HR 94 | Temp 99.8°F | Resp 16 | Ht 67.0 in | Wt 182.0 lb

## 2015-03-28 DIAGNOSIS — Z3169 Encounter for other general counseling and advice on procreation: Secondary | ICD-10-CM

## 2015-03-28 DIAGNOSIS — L03119 Cellulitis of unspecified part of limb: Secondary | ICD-10-CM

## 2015-03-28 MED ORDER — MUPIROCIN 2 % EX OINT: 1.0000 "application " | TOPICAL_OINTMENT | Freq: Two times a day (BID) | CUTANEOUS | Status: AC

## 2015-03-28 MED ORDER — DOXYCYCLINE HYCLATE 100 MG PO TABS: 100.0000 mg | ORAL_TABLET | Freq: Two times a day (BID) | ORAL | Status: AC

## 2015-03-28 NOTE — Progress Notes (Signed)
Subjective:    Patient ID: Kayla Figueroa, female    DOB: 01-18-1981, 34 y.o.   MRN: [id number]  HPI: Kayla Figueroa is a 34 y.o. female presenting on 03/28/2015 for Rash   Rash This is a new problem. The current episode started in the past 7 days. The problem has been gradually worsening since onset. The affected locations include the right shoulder. The rash is characterized by redness, itchiness and swelling. It is unknown if there was an exposure to a precipitant. Associated symptoms include fatigue (mild tiredness) and a fever (low grade fever 99.8F). Pertinent negatives include no congestion, cough, diarrhea, facial edema, joint pain, shortness of breath or vomiting. Past treatments include anti-itch cream. The treatment provided mild relief.   [place] no known tick bites and did not pull anything off the shoulder. Rash was first noted as single erythematous papule on right shoulder. Redness has been spreading since rash noted on Saturday 5/28. Fever have started since the rash began. No arthralgias. Pt reports feeling somewhat tired but attributed to work.   Pt has also noted that she and her husband have stopped using contraception. They are not actively trying for pregnancy but are okay with it's occurrence.   Past Medical History  Diagnosis Date  . Depression   . Heart murmur     present in high school--no longer present  . Allergy   . Anxiety   . Chronic fatigue     Current Outpatient Prescriptions on File Prior to Visit  Medication Sig  . buPROPion (WELLBUTRIN SR) 100 MG 12 hr tablet Take 100 mg by mouth daily.  . calcium carbonate (OS-CAL) 600 MG TABS Take 600 mg by mouth 2 (two) times daily with a meal.  . DULoxetine (CYMBALTA) 60 MG capsule Take 60 mg by mouth daily.  . Multiple Vitamin (MULTIVITAMIN) capsule Take 1 capsule by mouth daily.  . Flaxseed, Linseed, (FLAX SEED OIL PO) Take 1 applicator by mouth daily.  Marland Kitchen lactobacillus acidophilus (BACID)  TABS tablet Take 2 tablets by mouth as needed.  . norethindrone-ethinyl estradiol-iron (MICROGESTIN FE,GILDESS FE,LOESTRIN FE) 1.5-30 MG-MCG tablet Take 1 tablet by mouth daily. (Patient not taking: Reported on 03/28/2015)   No current facility-administered medications on file prior to visit.    Review of Systems  Constitutional: Positive for fever (low grade fever 99.8F) and fatigue (mild tiredness). Negative for chills.  HENT: Negative.  Negative for congestion.   Respiratory: Negative for cough, chest tightness, shortness of breath and wheezing.   Cardiovascular: Negative for chest pain and leg swelling.  Gastrointestinal: Negative for nausea, vomiting, abdominal pain, diarrhea and constipation.  Endocrine: Negative.  Negative for cold intolerance, heat intolerance, polydipsia, polyphagia and polyuria.  Genitourinary: Negative for dysuria and difficulty urinating.  Musculoskeletal: Negative.  Negative for joint pain.  Skin: Positive for rash.  Neurological: Negative for dizziness, light-headedness and numbness.  Psychiatric/Behavioral: Negative.    Per HPI unless specifically indicated above     Objective:    BP 100/71 mmHg  Pulse 94  Temp(Src) 99.8 F (37.7 C) (Oral)  Resp 16  Ht  (1.702 m)  Wt 182 lb (82.555 kg)  BMI 28.50 kg/m2  LMP 03/01/2015  Wt Readings from Last 3 Encounters:  03/28/15 182 lb (82.555 kg)  02/14/14 162 lb 3.2 oz (73.573 kg)  04/11/13 154 lb 8 oz (70.081 kg)    Physical Exam  Constitutional: She appears well-developed and well-nourished.  Cardiovascular: Normal rate and normal heart sounds.  Exam reveals no gallop and no friction rub.   No murmur heard. Pulmonary/Chest: No respiratory distress. She has no wheezes. She exhibits no tenderness.  Lymphadenopathy:    She has no cervical adenopathy.    She has no axillary adenopathy.  Skin: Skin is warm and dry. Lesion and rash noted. No bruising and no ecchymosis noted. Rash is macular. She is not  diaphoretic. There is erythema. No pallor.      Results for orders placed or performed in visit on 02/14/14  Rubella Antibody, IgM  Result Value Ref Range   Rubella IgM 0.42 <0.90      Assessment & Plan:   Problem List Items Addressed This Visit    None    Visit Diagnoses    Cellulitis of shoulder    -  Primary    Treat for cellulitis given appearance and history. Despite childbearing age, doxy is best option for antibiotic coverage due to [facility],sulfa allergy, unknown tick exposure. Consider RMSF/lymes titers if not resolved.    Relevant Medications    doxycycline (VIBRA-TABS) 100 MG tablet    mupirocin ointment (BACTROBAN) 2 %    Encounter for preconception consultation        Reviewed risk of doxycycline in pregnancy. Pt had decline in office urine pregnancy testing. Period is due this weekend. She and husband will use protection.           Patient is taking a prenatal vitamin. Reviewed folic acid needs    Meds ordered this encounter  Medications  . doxycycline (VIBRA-TABS) 100 MG tablet    Sig: Take 1 tablet (100 mg total) by mouth 2 (two) times daily.    Dispense:  20 tablet    Refill:  0    Order Specific Question:  Supervising Provider    Answer:  Janeann ForehandHAWKINS JR, JAMES H [phone][[id number]]  . mupirocin ointment (BACTROBAN) 2 %    Sig: Apply 1 application topically 2 (two) times daily.    Dispense:  22 g    Refill:  0    Order Specific Question:  Supervising Provider    Answer:  Janeann ForehandHAWKINS JR, JAMES H [[id number]]  . Prenatal Vit-Fe Fumarate-FA (PRENATAL MULTIVITAMIN) TABS tablet    Sig: Take 1 tablet by mouth daily at 12 noon.      Follow up plan: Return if symptoms worsen or fail to improve within 1 week.

## 2015-03-28 NOTE — Patient Instructions (Addendum)
Please take doxycycline twice daily for 10 days . Please finish full course of antibiotics. Apply bactroban ointment twice daily.  Please call for spreading redness, warmth, fever, fatigue. IF symptoms do not improve on antbiotics please let me know.   Please consider taking a home pregnancy test prior to starting the antibiotic to ensure you are not pregnant. Also please use protection while you are taking the antibiotic since you and your husband are trying to get pregnant and this medication may harm the fetus.

## 2015-07-10 ENCOUNTER — Ambulatory Visit (INDEPENDENT_AMBULATORY_CARE_PROVIDER_SITE_OTHER): Payer: 59 | Admitting: Family Medicine

## 2015-07-10 ENCOUNTER — Encounter: Payer: Self-pay | Admitting: Family Medicine

## 2015-07-10 VITALS — BP 122/81 | HR 91 | Temp 99.1°F | Resp 16 | Ht 67.0 in | Wt 186.0 lb

## 2015-07-10 DIAGNOSIS — S060X0A Concussion without loss of consciousness, initial encounter: Secondary | ICD-10-CM | POA: Diagnosis not present

## 2015-07-10 DIAGNOSIS — S161XXA Strain of muscle, fascia and tendon at neck level, initial encounter: Secondary | ICD-10-CM | POA: Diagnosis not present

## 2015-07-10 MED ORDER — TRAMADOL HCL 50 MG PO TABS: 50.0000 mg | ORAL_TABLET | Freq: Four times a day (QID) | ORAL | Status: DC | PRN
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
## 2016-08-18 ENCOUNTER — Encounter: Payer: Self-pay | Admitting: Family Medicine

## 2016-08-18 ENCOUNTER — Ambulatory Visit (INDEPENDENT_AMBULATORY_CARE_PROVIDER_SITE_OTHER): Payer: BLUE CROSS/BLUE SHIELD | Admitting: Family Medicine

## 2016-08-18 VITALS — BP 117/74 | HR 91 | Temp 99.1°F | Resp 16 | Ht 67.0 in | Wt 195.0 lb

## 2016-08-18 DIAGNOSIS — Z23 Encounter for immunization: Secondary | ICD-10-CM | POA: Diagnosis not present

## 2016-08-18 DIAGNOSIS — T783XXA Angioneurotic edema, initial encounter: Secondary | ICD-10-CM

## 2016-08-18 MED ORDER — CETIRIZINE HCL 10 MG PO TABS: 10.0000 mg | ORAL_TABLET | Freq: Every day | ORAL | 11 refills | Status: AC

## 2016-08-18 NOTE — Progress Notes (Signed)
Subjective:    Patient ID: Kayla Figueroa, female    DOB: 07-Sep-1981, 35 y.o.   MRN: [id number]  Kayla Figueroa is a 35 y.o. female presenting on 08/18/2016 for Allergic Reaction (swollen lips 10/07 taken zyrtec but still feels not improved has not used any lips product)   HPI  ANGIOEDEMA LIPS, Recurrent: - Reports very first similar episode of lip swelling back in 2011, she was treated was Zyrtec thought to be allergic reaction, resolved in a few days. Now presents with current episode of lower and upper lip swelling only onset about 12 days ago without known trigger. She started Zyrtec for several days with improvement, then stopped. Currently with mostly resolved lip swelling, but has had problem with dry chapped lips and rough edges, even with a small "split or tear in skin at edge of lip", felt sore and irritated, gradual improving, using topical ointment lanolin barrier frequently tolerating well. - Has had recurrent lip swelling episodes approx 1x yearly - Denies any recent change of products, medications, or any exposures. Does admit to some increased stress at work recently but no significant other triggers. - Additional prior history of psoriasis concern with dry skin patches on her elbows back in High School, within past 1 year she developed some mild dry skin patches on scalp, seems mostly limited there without worsening. - Denies any fevers/chills, systemic symptoms without nausea, vomiting  Social History  Substance Use Topics  . Smoking status: Never Smoker  . Smokeless tobacco: Never Used  . Alcohol use No    Review of Systems Per HPI unless specifically indicated above     Objective:    BP 117/74   Pulse 91   Temp 99.1 F (37.3 C) (Oral)   Resp 16   Ht 5\' 7"  (1.702 m)   Wt 195 lb (88.5 kg)   LMP 08/05/2016   BMI 30.54 kg/m   Wt Readings from Last 3 Encounters:  08/18/16 195 lb (88.5 kg)  07/10/15 186 lb (84.4 kg)  03/28/15 182 lb (82.6 kg)    Physical  Exam  Constitutional: She appears well-developed and well-nourished. No distress.  Well-appearing, comfortable, cooperative  HENT:  Head: Normocephalic and atraumatic.  Mouth/Throat: Oropharynx is clear and moist.  Resolved lower lip edema, now with dry cracked lips and resolved left corner of mouth crease without ulceration erythema. Oral mucosa moist without ulceration or edema, no asymmetry of oropharynx.  No facial swelling.  Eyes: Conjunctivae are normal.  Cardiovascular: Normal rate, regular rhythm, normal heart sounds and intact distal pulses.   No murmur heard. Pulmonary/Chest: Effort normal and breath sounds normal. No respiratory distress. She has no wheezes. She has no rales.  Skin: Skin is warm and dry. No rash noted. She is not diaphoretic.  Nursing note and vitals reviewed.      Assessment & Plan:   Problem List Items Addressed This Visit    Angioedema of lips - Primary    Resolving angioedema isolated to lips x 12 days ago now. H/o recurrent similar allergic reaction (without facial or throat edema, or respiratory symptoms). No known specific allergen or trigger identified. No known food allergies. No history of anaphylaxis - Never established with Allergist - Well-appearing and stable resp status  Plan: 1. Start chronic anti-histamine blockade - Cetirizine 10mg  daily 2. May take Zantac or Pepcid for H2 blocker if recurrence as well with cetirizine 3. Referral to [facility]ist - [facility] [place] 4. Follow-up within 4 weeks as needed - advised if  recurrence mild symptoms may consider prednisone vs if acute worsening or red flag symptoms when to go to ED       Relevant Medications   cetirizine (ZYRTEC) 10 MG tablet   Other Relevant Orders   Ambulatory referral to Allergy    Other Visit Diagnoses    Need for influenza vaccination       Relevant Orders   Flu Vaccine QUAD 36+ mos PF IM (Fluarix & Fluzone Quad PF) (Completed)      Meds ordered this encounter    Medications  . Melatonin 3 MG TABS    Sig: Take by mouth.  . cetirizine (ZYRTEC) 10 MG tablet    Sig: Take 1 tablet (10 mg total) by mouth daily.    Dispense:  30 tablet    Refill:  11      Follow up plan: Return in about 4 weeks (around 09/15/2016), or if symptoms worsen or fail to improve, for angioedema lip swelling.  Saralyn Pilar, DO [facility] 08/18/2016, 5:13 PM

## 2016-08-18 NOTE — Assessment & Plan Note (Signed)
Resolving angioedema isolated to lips x 12 days ago now. H/o recurrent similar allergic reaction (without facial or throat edema, or respiratory symptoms). No known specific allergen or trigger identified. No known food allergies. No history of anaphylaxis - Never established with Allergist - Well-appearing and stable resp status  Plan: 1. Start chronic anti-histamine blockade - Cetirizine 10mg  daily 2. May take Zantac or Pepcid for H2 blocker if recurrence as well with cetirizine 3. Referral to [place]ist - [facility] [place] 4. Follow-up within 4 weeks as needed - advised if recurrence mild symptoms may consider prednisone vs if acute worsening or red flag symptoms when to go to ED

## 2016-08-18 NOTE — Patient Instructions (Addendum)
Thank you for coming in to clinic today.  1. For your lip swelling, this is considered Angioedema, and it is on the mild end of spectrum, since you did not have facial swelling or throat swelling or other systemic symptoms. It can be due to any cause as we discussed, I do not think your trigger is medication or seasonal allergens. Possible food allergen. - Given chronic recurrent nature, we should have you establish with Allergist for further evaluation and testing - Continue to take Cetirizine (Zyrtec) 10mg  daily every day for next several months, I would recommend try it for 1 year to see if prevent this problem, also if you get a bad flare up you may try over the counter, Zantac or Raniditine as a H2 blocker in addition to the Cetirizine. - If acute worsening swelling involving face or throat, difficulty breathing or other systemic symptoms, you should go directly to the Emergency Department, if just recurrent episode but persistent you can notify us for trial on Prednisone  Referral placed, stay tuned for an appointment, call their office if you do not hear back in 2 weeks or more.  [facility] [address] [place], [place]  [id number] [phone] (phone)  Please schedule a follow-up appointment with Dr. Althea CharonKaramalegos within 4 weeks as needed if any recurrent lip swelling or allergy symptoms  If you have any other questions or concerns, please feel free to call the clinic or send a message through MyChart. You may also schedule an earlier appointment if necessary.  Saralyn PilarAlexander Caleesi Kohl, DO [facility]

## 2018-04-21 ENCOUNTER — Other Ambulatory Visit: Payer: Self-pay

## 2018-04-21 ENCOUNTER — Encounter
Admission: RE | Admit: 2018-04-21 | Discharge: 2018-04-21 | Disposition: A | Discharge status: Home / Self Care | Payer: BLUE CROSS/BLUE SHIELD | Source: Ambulatory Visit | Attending: Obstetrics & Gynecology | Admitting: Obstetrics & Gynecology

## 2018-04-21 HISTORY — DX: Gastro-esophageal reflux disease without esophagitis: K21.9

## 2018-04-21 NOTE — Patient Instructions (Signed)
Your procedure is scheduled on: 04-24-18 Report to Same Day Surgery 2nd floor medical mall [facility](Medical Mall Entrance-take elevator on left to 2nd floor.  Check in with surgery information desk.)@ 1030   Remember: Instructions that are not followed completely may result in serious medical risk, up to and including death, or upon the discretion of your surgeon and anesthesiologist your surgery may need to be rescheduled.    _x___ 1. Do not eat food after midnight the night before your procedure. NO GUM OR CANDY AFTER MIDNIGHT. You may drink clear liquids up to 2 hours before you are scheduled to arrive at the hospital for your procedure.  Do not drink clear liquids within 2 hours of your scheduled arrival to the hospital.  Clear liquids include  --Water or Apple juice without pulp  --Clear carbohydrate beverage such as ClearFast or Gatorade  --Black Coffee or Clear Tea (No milk, no creamers, do not add anything to the coffee or Tea     __x__ 2. No Alcohol for 24 hours before or after surgery.   __x__3. No Smoking or e-cigarettes for 24 prior to surgery.  Do not use any chewable tobacco products for at least 6 hour prior to surgery   ____  4. Bring all medications with you on the day of surgery if instructed.    __x__ 5. Notify your doctor if there is any change in your medical condition     (cold, fever, infections).    x___6. On the morning of surgery brush your teeth with toothpaste and water.  You may rinse your mouth with mouth wash if you wish.  Do not swallow any toothpaste or mouthwash.   Do not wear jewelry, make-up, hairpins, clips or nail polish.  Do not wear lotions, powders, or perfumes. You may wear deodorant.  Do not shave 48 hours prior to surgery. Men may shave face and neck.  Do not bring valuables to the hospital.    [facility] is not responsible for any belongings or valuables.               Contacts, dentures or bridgework may not be worn into surgery.  Leave your  suitcase in the car. After surgery it may be brought to your room.  For patients admitted to the hospital, discharge time is determined by your treatment team.  _  Patients discharged the day of surgery will not be allowed to drive home.  You will need someone to drive you home and stay with you the night of your procedure.    Please read over the following fact sheets that you were given:   [facility] Preparing for [facility] and or MRSA Information   _x___ TAKE THE FOLLOWING MEDICATION THE MORNING OF SURGERY WITH A SMALL SIP OF WATER. These include:  1. WELLBUTRIN  2. ZYRTEC  3. CYMBALTA  4.  5.  6.  ____Fleets enema or Magnesium Citrate as directed.   ____ Use CHG Soap or sage wipes as directed on instruction sheet   ____ Use inhalers on the day of surgery and bring to hospital day of surgery  ____ Stop Metformin and Janumet 2 days prior to surgery.    ____ Take 1/2 of usual insulin dose the night before surgery and none on the morning surgery.   ____ Follow recommendations from Cardiologist, Pulmonologist or PCP regarding stopping Aspirin, Coumadin, Plavix ,Eliquis, Effient, or Pradaxa, and Pletal.  X____Stop Anti-inflammatories such as Advil, Aleve, Ibuprofen, Motrin, Naproxen, Naprosyn, Goodies powders or  aspirin products NOW-OK to take Tylenol   ____ Stop supplements until after surgery.     ____ Bring C-Pap to the hospital.

## 2018-04-24 ENCOUNTER — Ambulatory Visit: Payer: BLUE CROSS/BLUE SHIELD | Admitting: Anesthesiology

## 2018-04-24 ENCOUNTER — Ambulatory Visit
Admission: RE | Admit: 2018-04-24 | Discharge: 2018-04-24 | Disposition: A | Discharge status: Home / Self Care | Payer: BLUE CROSS/BLUE SHIELD | Source: Ambulatory Visit | Attending: Obstetrics & Gynecology | Admitting: Obstetrics & Gynecology

## 2018-04-24 ENCOUNTER — Other Ambulatory Visit: Payer: Self-pay

## 2018-04-24 ENCOUNTER — Encounter
Admission: RE | Disposition: A | Discharge status: Home / Self Care | Payer: Self-pay | Source: Ambulatory Visit | Attending: Obstetrics & Gynecology

## 2018-04-24 DIAGNOSIS — Z882 Allergy status to sulfonamides status: Secondary | ICD-10-CM | POA: Insufficient documentation

## 2018-04-24 DIAGNOSIS — N938 Other specified abnormal uterine and vaginal bleeding: Secondary | ICD-10-CM | POA: Diagnosis present

## 2018-04-24 DIAGNOSIS — R5382 Chronic fatigue, unspecified: Secondary | ICD-10-CM | POA: Diagnosis not present

## 2018-04-24 DIAGNOSIS — F329 Major depressive disorder, single episode, unspecified: Secondary | ICD-10-CM | POA: Diagnosis not present

## 2018-04-24 DIAGNOSIS — K219 Gastro-esophageal reflux disease without esophagitis: Secondary | ICD-10-CM | POA: Insufficient documentation

## 2018-04-24 DIAGNOSIS — F419 Anxiety disorder, unspecified: Secondary | ICD-10-CM | POA: Diagnosis not present

## 2018-04-24 DIAGNOSIS — N939 Abnormal uterine and vaginal bleeding, unspecified: Secondary | ICD-10-CM | POA: Diagnosis present

## 2018-04-24 DIAGNOSIS — N840 Polyp of corpus uteri: Secondary | ICD-10-CM | POA: Diagnosis not present

## 2018-04-24 DIAGNOSIS — R9389 Abnormal findings on diagnostic imaging of other specified body structures: Secondary | ICD-10-CM | POA: Diagnosis present

## 2018-04-24 HISTORY — PX: HYSTEROSCOPY WITH D & C: SHX1775

## 2018-04-24 LAB — CBC
HEMATOCRIT: 41.3 % (ref 35.0–47.0)
HEMOGLOBIN: 13.9 g/dL (ref 12.0–16.0)
MCH: 28.4 pg (ref 26.0–34.0)
MCHC: 33.6 g/dL (ref 32.0–36.0)
MCV: 84.6 fL (ref 80.0–100.0)
Platelets: 234 10*3/uL (ref 150–440)
RBC: 4.88 MIL/uL (ref 3.80–5.20)
RDW: 13.5 % (ref 11.5–14.5)
WBC: 4.3 10*3/uL (ref 3.6–11.0)

## 2018-04-24 LAB — BASIC METABOLIC PANEL
Anion gap: 8 (ref 5–15)
BUN: 9 mg/dL (ref 6–20)
CO2: 22 mmol/L (ref 22–32)
Calcium: 8.7 mg/dL — ABNORMAL LOW (ref 8.9–10.3)
Chloride: 106 mmol/L (ref 98–111)
Creatinine, Ser: 0.93 mg/dL (ref 0.44–1.00)
GFR calc Af Amer: >60 mL/min (ref >60)
GFR calc non Af Amer: >60 mL/min (ref >60)
GLUCOSE: 102 mg/dL — AB (ref 70–99)
POTASSIUM: 4.1 mmol/L (ref 3.5–5.1)
Sodium: 136 mmol/L (ref 135–145)

## 2018-04-24 LAB — POCT PREGNANCY, URINE: PREG TEST UR: NEGATIVE

## 2018-04-24 LAB — TYPE AND SCREEN
ABO/RH(D): O POS
ANTIBODY SCREEN: NEGATIVE

## 2018-04-24 LAB — ABO/RH: ABO/RH(D): O POS

## 2018-04-24 SURGERY — DILATATION AND CURETTAGE /HYSTEROSCOPY
Anesthesia: General

## 2018-04-24 MED ORDER — ACETAMINOPHEN 650 MG RE SUPP
650.0000 mg | RECTAL | Status: DC | PRN
  Filled 2018-04-24: qty 1

## 2018-04-24 MED ORDER — ACETAMINOPHEN 325 MG PO TABS: 650.0000 mg | ORAL_TABLET | ORAL | Status: DC | PRN

## 2018-04-24 MED ORDER — PROPOFOL 10 MG/ML IV BOLUS
INTRAVENOUS | Status: AC
  Filled 2018-04-24: qty 20

## 2018-04-24 MED ORDER — MORPHINE SULFATE (PF) 4 MG/ML IV SOLN: 1.0000 mg | INTRAVENOUS | Status: DC | PRN

## 2018-04-24 MED ORDER — LACTATED RINGERS IV SOLN
INTRAVENOUS | Status: DC
  Administered 2018-04-24 (×2): via INTRAVENOUS

## 2018-04-24 MED ORDER — FENTANYL CITRATE (PF) 100 MCG/2ML IJ SOLN: 25.0000 ug | INTRAMUSCULAR | Status: DC | PRN

## 2018-04-24 MED ORDER — KETOROLAC TROMETHAMINE 30 MG/ML IJ SOLN
30.0000 mg | Freq: Four times a day (QID) | INTRAMUSCULAR | Status: DC
  Administered 2018-04-24: 30 mg via INTRAVENOUS
  Filled 2018-04-24: qty 1

## 2018-04-24 MED ORDER — DEXAMETHASONE SODIUM PHOSPHATE 10 MG/ML IJ SOLN
INTRAMUSCULAR | Status: DC | PRN
  Administered 2018-04-24: 10 mg via INTRAVENOUS

## 2018-04-24 MED ORDER — FAMOTIDINE 20 MG PO TABS
ORAL_TABLET | ORAL | Status: AC
  Filled 2018-04-24: qty 1

## 2018-04-24 MED ORDER — FAMOTIDINE 20 MG PO TABS
20.0000 mg | ORAL_TABLET | Freq: Once | ORAL | Status: AC
  Administered 2018-04-24: 20 mg via ORAL

## 2018-04-24 MED ORDER — MIDAZOLAM HCL 2 MG/2ML IJ SOLN
INTRAMUSCULAR | Status: AC
  Filled 2018-04-24: qty 2

## 2018-04-24 MED ORDER — FENTANYL CITRATE (PF) 100 MCG/2ML IJ SOLN
INTRAMUSCULAR | Status: DC | PRN
  Administered 2018-04-24 (×3): 50 ug via INTRAVENOUS

## 2018-04-24 MED ORDER — LACTATED RINGERS IV SOLN: INTRAVENOUS | Status: DC

## 2018-04-24 MED ORDER — FENTANYL CITRATE (PF) 100 MCG/2ML IJ SOLN
INTRAMUSCULAR | Status: AC
  Filled 2018-04-24: qty 2

## 2018-04-24 MED ORDER — KETOROLAC TROMETHAMINE 30 MG/ML IJ SOLN
INTRAMUSCULAR | Status: AC
  Filled 2018-04-24: qty 1

## 2018-04-24 MED ORDER — PROPOFOL 10 MG/ML IV BOLUS
INTRAVENOUS | Status: DC | PRN
  Administered 2018-04-24: 150 mg via INTRAVENOUS

## 2018-04-24 MED ORDER — MIDAZOLAM HCL 2 MG/2ML IJ SOLN
INTRAMUSCULAR | Status: DC | PRN
  Administered 2018-04-24: 2 mg via INTRAVENOUS

## 2018-04-24 MED ORDER — SILVER NITRATE-POT NITRATE 75-25 % EX MISC
CUTANEOUS | Status: DC | PRN
  Administered 2018-04-24: 2 via TOPICAL

## 2018-04-24 MED ORDER — ONDANSETRON HCL 4 MG/2ML IJ SOLN
INTRAMUSCULAR | Status: DC | PRN
  Administered 2018-04-24: 4 mg via INTRAVENOUS

## 2018-04-24 MED ORDER — PROPOFOL 10 MG/ML IV BOLUS
INTRAVENOUS | Status: AC
  Filled 2018-04-24: qty 60

## 2018-04-24 MED ORDER — ONDANSETRON HCL 4 MG/2ML IJ SOLN: 4.0000 mg | Freq: Once | INTRAMUSCULAR | Status: DC | PRN

## 2018-04-24 MED ORDER — LIDOCAINE HCL (CARDIAC) PF 100 MG/5ML IV SOSY
PREFILLED_SYRINGE | INTRAVENOUS | Status: DC | PRN
  Administered 2018-04-24: 100 mg via INTRAVENOUS

## 2018-04-24 SURGICAL SUPPLY — 18 items
BAG INFUSER PRESSURE 100CC (MISCELLANEOUS) ×2 IMPLANT
ELECT REM PT RETURN 9FT ADLT (ELECTROSURGICAL) ×2
ELECTRODE REM PT RTRN 9FT ADLT (ELECTROSURGICAL) ×1 IMPLANT
GLOVE PI ORTHOPRO 6.5 (GLOVE) ×1
GLOVE PI ORTHOPRO STRL 6.5 (GLOVE) ×1 IMPLANT
GLOVE SURG SYN 6.5 ES PF (GLOVE) ×2 IMPLANT
GOWN STRL REUS W/ TWL LRG LVL3 (GOWN DISPOSABLE) ×2 IMPLANT
GOWN STRL REUS W/TWL LRG LVL3 (GOWN DISPOSABLE) ×2
IV LACTATED RINGERS 1000ML (IV SOLUTION) ×2 IMPLANT
KIT TURNOVER CYSTO (KITS) ×2 IMPLANT
NEEDLE SPNL 22GX3.5 QUINCKE BK (NEEDLE) ×2 IMPLANT
PACK DNC HYST (MISCELLANEOUS) ×2 IMPLANT
PAD OB MATERNITY 4.3X12.25 (PERSONAL CARE ITEMS) ×2 IMPLANT
PAD PREP 24X41 OB/GYN DISP (PERSONAL CARE ITEMS) ×2 IMPLANT
SEAL ROD LENS SCOPE MYOSURE (ABLATOR) ×2 IMPLANT
SYR 10ML LL (SYRINGE) ×2 IMPLANT
TUBING CONNECTING 10 (TUBING) ×2 IMPLANT
TUBING HYSTEROSCOPY DOLPHIN (MISCELLANEOUS) ×2 IMPLANT

## 2018-04-24 NOTE — Anesthesia Procedure Notes (Signed)
Procedure Name: LMA Insertion Date/Time: 04/24/2018 1:15 PM Performed by: Pearla DubonnetHuang, Bertis Hustead, CRNA Pre-anesthesia Checklist: Patient identified, Emergency Drugs available, Suction available, Patient being monitored and Timeout performed Patient Re-evaluated:Patient Re-evaluated prior to induction Oxygen Delivery Method: Circle system utilized Preoxygenation: Pre-oxygenation with 100% oxygen Induction Type: IV induction Ventilation: Mask ventilation without difficulty LMA: LMA inserted LMA Size: 4.5

## 2018-04-24 NOTE — H&P (Signed)
Preoperative History and Physical  Kayla Figueroa is a 37 y.o. G0P0 here for surgical management of abnormal uterine bleeding and thickened endometrium.  She has not responded to hormonal therapy.   No significant preoperative concerns.  Proposed surgery: dilation and curettage, hysteroscopy  Past Medical History:  Diagnosis Date  . Allergy   . Anxiety   . Chronic fatigue   . Depression   . GERD (gastroesophageal reflux disease)    RARE  . Heart murmur    present in high school--no longer present   Past Surgical History:  Procedure Laterality Date  . TONSILLECTOMY AND ADENOIDECTOMY  2005   as an adult   OB History  Gravida Para Term Preterm AB Living  0            SAB TAB Ectopic Multiple Live Births             Patient denies any other pertinent gynecologic issues.   No current facility-administered medications on file prior to encounter.    Current Outpatient Medications on File Prior to Encounter  Medication Sig Dispense Refill  . buPROPion (WELLBUTRIN SR) 100 MG 12 hr tablet Take 100 mg by mouth every morning.     . calcium carbonate (OS-CAL) 600 MG TABS Take 600 mg by mouth every evening.     . cetirizine (ZYRTEC) 10 MG tablet Take 1 tablet (10 mg total) by mouth daily. (Patient taking differently: Take 10 mg by mouth every morning. ) 30 tablet 11  . docusate sodium (COLACE) 100 MG capsule Take 100 mg by mouth 2 (two) times daily.    . DULoxetine (CYMBALTA) 30 MG capsule Take 30 mg by mouth every morning.   0  . ibuprofen (ADVIL,MOTRIN) 200 MG tablet Take 400 mg by mouth every 8 (eight) hours as needed (for pain/cramping.).    Marland Kitchen. medroxyPROGESTERone (PROVERA) 10 MG tablet Take 10 mg by mouth 2 (two) times daily.    . Melatonin 3 MG TABS Take 3 mg by mouth at bedtime as needed (for sleep.).     Marland Kitchen. oxycodone (OXY-IR) 5 MG capsule Take 5 mg by mouth every 4 (four) hours as needed.    . Prenatal Vit-Fe Fumarate-FA (PRENATAL MULTIVITAMIN) TABS tablet Take 2 tablets by mouth  every evening.      Allergies  Allergen Reactions  . Sulfa Antibiotics Hives    Social History:   reports that she has never smoked. She has never used smokeless tobacco. She reports that she does not drink alcohol or use drugs.  Family History  Problem Relation Age of Onset  . Hyperlipidemia Father   . Colon polyps Father   . Diverticulosis Father        has had bowel resection for diverticulitis  . Osteoporosis Mother   . Osteoporosis Maternal Grandmother     Review of Systems: Noncontributory  PHYSICAL EXAM: Blood pressure 136/85, pulse 93, temperature 98 F (36.7 C), temperature source Oral, resp. rate 18, height 5\' 7"  (1.702 m), weight 92.4 kg (203 lb 11.2 oz), SpO2 100 %. General appearance - alert, well appearing, and in no distress Chest - clear to auscultation, no wheezes, rales or rhonchi, symmetric air entry Heart - normal rate and regular rhythm Abdomen - soft, nontender, nondistended, no masses or organomegaly Pelvic - examination not indicated Extremities - peripheral pulses normal, no pedal edema, no clubbing or cyanosis  Labs: Results for orders placed or performed during the hospital encounter of 04/24/18 (from the past 336 hour(s))  Pregnancy, urine  POC   Collection Time: 04/24/18 10:40 AM  Result Value Ref Range   Preg Test, Ur NEGATIVE NEGATIVE    Imaging Studies: No results found.  Assessment: Patient Active Problem List   Diagnosis Date Noted  . Abnormal uterine bleeding 04/24/2018  . Thickened endometrium 04/24/2018    Plan: Patient will undergo surgical management with dilation and curettage, hysteroscopy.   The risks of surgery were discussed in detail with the patient including but not limited to: bleeding which may require transfusion or reoperation; infection which may require antibiotics; injury to surrounding organs which may involve bowel, bladder, ureters ; need for additional procedures including laparoscopy or laparotomy;  thromboembolic phenomenon, surgical site problems and other postoperative/anesthesia complications. Likelihood of success in alleviating the patient's condition was discussed. Routine postoperative instructions will be reviewed with the patient and her family in detail after surgery.  The patient concurred with the proposed plan, giving informed written consent for the surgery.  Patient has been NPO since last night she will remain NPO for procedure.  Anesthesia and OR aware.    To OR when ready.  ----- Ranae Plumber, MD Attending Obstetrician and Gynecologist [facility], Department of OB/GYN [facility]

## 2018-04-24 NOTE — Anesthesia Preprocedure Evaluation (Addendum)
Anesthesia Evaluation  Patient identified by MRN, date of birth, ID band Patient awake    Reviewed: Allergy & Precautions, NPO status , Patient's Chart, lab work & pertinent test results  Airway Mallampati: II  TM Distance: >3 FB     Dental   Pulmonary neg pulmonary ROS,    Pulmonary exam normal        Cardiovascular negative cardio ROS Normal cardiovascular exam+ Valvular Problems/Murmurs      Neuro/Psych  Headaches, PSYCHIATRIC DISORDERS Anxiety Depression    GI/Hepatic Neg liver ROS, GERD  Controlled,  Endo/Other  negative endocrine ROS  Renal/GU negative Renal ROS     Musculoskeletal negative musculoskeletal ROS (+)   Abdominal Normal abdominal exam  (+)   Peds negative pediatric ROS (+)  Hematology negative hematology ROS (+)   Anesthesia Other Findings   Reproductive/Obstetrics                             Anesthesia Physical Anesthesia Plan  ASA: II  Anesthesia Plan: General   Post-op Pain Management:    Induction: Intravenous  PONV Risk Score and Plan:   Airway Management Planned: LMA  Additional Equipment:   Intra-op Plan:   Post-operative Plan: Extubation in OR  Informed Consent: I have reviewed the patients History and Physical, chart, labs and discussed the procedure including the risks, benefits and alternatives for the proposed anesthesia with the patient or authorized representative who has indicated his/her understanding and acceptance.   Dental advisory given  Plan Discussed with: CRNA and Surgeon  Anesthesia Plan Comments:         Anesthesia Quick Evaluation

## 2018-04-24 NOTE — Op Note (Signed)
Operative Report Hysteroscopy, Dilation and Curettage 04/24/2018  Patient:  Kayla Figueroa  37 y.o. female Preoperative diagnosis:  Abnormal uterine bleeding, thickened endometrium Postoperative diagnosis:  thickened endometrium  PROCEDURE:  Procedure(s): DILATATION AND CURETTAGE /HYSTEROSCOPY (N/A) Surgeon:  Surgeon(s) and Role:    * Katanya Schlie, Elenora Fenderhelsea C, MD - Primary Anesthesia:  LMA I/O: Total I/O In: 1720 [P.O.:320; I.V.:1400] Out: 100 [Urine:100] Specimens:  Endometrial curettings Complications: None Apparent Disposition:  VS stable to PACU  Findings: Uterus, mobile, normal size, sounding to 9 cm; normal cervix, vagina, perineum.  Hysteroscopy:  Bloody fluid and thick endometrium.  Long cervix.  Small long and thin polyp on right posterior wall.  Indication for procedure/Consents: 37 y.o. G0P0  here for scheduled surgery for the aforementioned diagnoses. She was unresponsive to hormonal interventions. Risks of surgery were discussed with the patient including but not limited to: bleeding which may require transfusion; infection which may require antibiotics; injury to uterus or surrounding organs; intrauterine scarring which may impair future fertility; need for additional procedures including laparotomy or laparoscopy; and other postoperative/anesthesia complications. Written informed consent was obtained.    Procedure Details:   The patient was then taken to the operating room where anesthesia was administered and was found to be adequate.  After a formal timeout was performed, she was placed in the dorsal lithotomy position and examined with the above findings. She was then prepped and draped in the sterile manner.  A speculum was then placed in the patient's vagina and a single tooth tenaculum was applied to the anterior lip of the cervix.   The uterus was sounded to 9cm. Her cervix was serially dilated to accommodate the myoscope, with findings as above. A sharp curettage was then  performed until there was a gritty texture in all four quadrants. The specimen was handed off to nursing.  The camera was reinserted and confirmed the uterus had been evacuated. The tenaculum was removed from the anterior lip of the cervix, silver nitrate applied to tenaculum sites, and the vaginal speculum was removed after noting good hemostasis. The patient tolerated the procedure well and was taken to the recovery area awake, extubated and in stable condition.  The patient will be discharged to home as per PACU criteria.  Routine postoperative instructions given. She will follow up in the clinic in two to four weeks for postoperative evaluation.  Ranae Plumberhelsea Evalyne Cortopassi, MD [facility] Attending Gynecologist

## 2018-04-24 NOTE — Transfer of Care (Signed)
Immediate Anesthesia Transfer of Care Note  Patient: Kayla Figueroa  Procedure(s) Performed: DILATATION AND CURETTAGE /HYSTEROSCOPY (N/A )  Patient Location: PACU  Anesthesia Type:General  Level of Consciousness: awake  Airway & Oxygen Therapy: Patient Spontanous Breathing  Post-op Assessment: Report given to RN  Post vital signs: stable  Last Vitals:  Vitals Value Taken Time  BP 114/73 04/24/2018  2:16 PM  Temp 36.8 C 04/24/2018  2:16 PM  Pulse 91 04/24/2018  2:18 PM  Resp 15 04/24/2018  2:18 PM  SpO2 96 % 04/24/2018  2:18 PM  Vitals shown include unvalidated device data.  Last Pain:  Vitals:   04/24/18 1416  TempSrc:   PainSc: 0-No pain         Complications: No apparent anesthesia complications

## 2018-04-24 NOTE — Discharge Instructions (Signed)
AMBULATORY SURGERY  DISCHARGE INSTRUCTIONS   1) The drugs that you were given will stay in your system until tomorrow so for the next 24 hours you should not:  A) Drive an automobile B) Make any legal decisions C) Drink any alcoholic beverage   2) You may resume regular meals tomorrow.  Today it is better to start with liquids and gradually work up to solid foods.  You may eat anything you prefer, but it is better to start with liquids, then soup and crackers, and gradually work up to solid foods.   3) Please notify your doctor immediately if you have any unusual bleeding, trouble breathing, redness and pain at the surgery site, drainage, fever, or pain not relieved by medication.    4) Additional Instructions:        Please contact your physician with any problems or Same Day Surgery at [phone], Monday through Friday 6 am to 4 pm, or [place] [facility] number at [phone].You should expect to have some cramping and vaginal bleeding for about a week. This should taper off and subside, much like a period. If heavy bleeding continues or gets worse, you should contact the office for an earlier appointment.   Please call the office or physician on call for fever >101, severe pain, and heavy bleeding.   [phone]  NOTHING IN THE VAGINA FOR 2 WEEKS!!  Dr. Ward will discuss pathology results with you at your postop visit.  

## 2018-04-24 NOTE — Anesthesia Post-op Follow-up Note (Signed)
Anesthesia QCDR form completed.        

## 2018-04-25 ENCOUNTER — Encounter: Payer: Self-pay | Admitting: Obstetrics & Gynecology

## 2018-04-25 NOTE — Anesthesia Postprocedure Evaluation (Signed)
Anesthesia Post Note  Patient: Kayla Figueroa  Procedure(s) Performed: DILATATION AND CURETTAGE /HYSTEROSCOPY (N/A )  Patient location during evaluation: PACU Anesthesia Type: General Level of consciousness: awake and alert and oriented Pain management: pain level controlled Vital Signs Assessment: post-procedure vital signs reviewed and stable Respiratory status: spontaneous breathing Cardiovascular status: blood pressure returned to baseline Anesthetic complications: no     Last Vitals:  Vitals:   04/24/18 1455 04/24/18 1520  BP: 121/78 128/75  Pulse: 91 89  Resp: 18   Temp: (!) 35.7 C   SpO2: 100% 99%    Last Pain:  Vitals:   04/25/18 0943  TempSrc:   PainSc: 0-No pain                 Brenson Hartman

## 2018-04-26 LAB — SURGICAL PATHOLOGY

## 2018-07-27 DIAGNOSIS — F411 Generalized anxiety disorder: Secondary | ICD-10-CM | POA: Diagnosis not present

## 2018-07-27 DIAGNOSIS — F410 Panic disorder [episodic paroxysmal anxiety] without agoraphobia: Secondary | ICD-10-CM | POA: Diagnosis not present

## 2018-08-03 DIAGNOSIS — N912 Amenorrhea, unspecified: Secondary | ICD-10-CM | POA: Diagnosis not present

## 2018-12-05 DIAGNOSIS — N83209 Unspecified ovarian cyst, unspecified side: Secondary | ICD-10-CM | POA: Diagnosis not present

## 2018-12-05 DIAGNOSIS — Z13220 Encounter for screening for lipoid disorders: Secondary | ICD-10-CM | POA: Diagnosis not present

## 2018-12-05 DIAGNOSIS — N979 Female infertility, unspecified: Secondary | ICD-10-CM | POA: Diagnosis not present

## 2018-12-05 DIAGNOSIS — Z01419 Encounter for gynecological examination (general) (routine) without abnormal findings: Secondary | ICD-10-CM | POA: Diagnosis not present

## 2019-01-01 DIAGNOSIS — Z13220 Encounter for screening for lipoid disorders: Secondary | ICD-10-CM | POA: Diagnosis not present

## 2019-01-01 DIAGNOSIS — N979 Female infertility, unspecified: Secondary | ICD-10-CM | POA: Diagnosis not present

## 2019-01-19 DIAGNOSIS — N979 Female infertility, unspecified: Secondary | ICD-10-CM | POA: Diagnosis not present

## 2019-09-25 DIAGNOSIS — F411 Generalized anxiety disorder: Secondary | ICD-10-CM | POA: Diagnosis not present

## 2019-09-25 DIAGNOSIS — F339 Major depressive disorder, recurrent, unspecified: Secondary | ICD-10-CM | POA: Diagnosis not present

## 2019-12-20 DIAGNOSIS — Z01419 Encounter for gynecological examination (general) (routine) without abnormal findings: Secondary | ICD-10-CM | POA: Diagnosis not present

## 2019-12-20 DIAGNOSIS — Z1151 Encounter for screening for human papillomavirus (HPV): Secondary | ICD-10-CM | POA: Diagnosis not present

## 2019-12-24 LAB — HM PAP SMEAR: HM Pap smear: NEGATIVE

## 2020-06-17 DIAGNOSIS — R21 Rash and other nonspecific skin eruption: Secondary | ICD-10-CM | POA: Diagnosis not present

## 2020-06-22 DIAGNOSIS — R42 Dizziness and giddiness: Secondary | ICD-10-CM | POA: Diagnosis not present

## 2020-06-27 ENCOUNTER — Ambulatory Visit (INDEPENDENT_AMBULATORY_CARE_PROVIDER_SITE_OTHER): Payer: BC Managed Care – PPO | Admitting: Family Medicine

## 2020-06-27 ENCOUNTER — Other Ambulatory Visit: Payer: Self-pay

## 2020-06-27 ENCOUNTER — Encounter: Payer: Self-pay | Admitting: Family Medicine

## 2020-06-27 VITALS — BP 120/66 | HR 100 | Temp 98.2°F | Resp 16 | Ht 67.0 in | Wt 159.0 lb

## 2020-06-27 DIAGNOSIS — N951 Menopausal and female climacteric states: Secondary | ICD-10-CM | POA: Diagnosis not present

## 2020-06-27 DIAGNOSIS — R42 Dizziness and giddiness: Secondary | ICD-10-CM | POA: Diagnosis not present

## 2020-06-27 DIAGNOSIS — R002 Palpitations: Secondary | ICD-10-CM

## 2020-06-27 NOTE — Progress Notes (Signed)
Subjective:    Patient ID: Kayla Figueroa, female    DOB: Jun 03, 1981, 39 y.o.   MRN: [id number]  Kayla Figueroa is a 39 y.o. female presenting on 06/27/2020 for Headache and Palpitations (light headache, diaphoresis, nausea last for couple of minutes happened only x 4 last year)   HPI   Episodic Sweating / Palpitations / Dizziness Possible Hypoglycemia episodes  Reports a remote history since childhood with occasional rare episodes of hunger / hypoglycemia like episode, feeling lightheaded dizzy - Now worried that this year symptoms more often up to 4 x episodes in past 1 year.  - She says usually these episodes last up to 10 minutes max, usually not longer, often if eat something with sugar in it will improve.  -Last episode this past Sunday, 06/22/20 - She was traveling, describes on the floor, pale and sweating, palpitations. EMS was called, had evaluation by EMT, had fingerstick glucose, EKG was done reportedly normal. She did feel better. But then still had some persistent sweating throughout the night, episode, next day felt "worn out" tired and residual symptoms - She admits history of anxiety, she does not feel like it is panic related, she has not had specific triggers of anxiety or mood.  About 1-2 months ago, had other episode this year, also traveling she felt headache symptom, and thought it was more migraine related, had nausea, dry heaving, took some pieces of candy, stuck in traffic, overall was worn out, this episode lasted 15 min approx  Additional history - diagnosed with Primary Ovarian Insufficiency, instead of HRT. She was placed on OCP, [place], she will be following up with GYN to discuss HRT  Admits increasing episodes of menopausal symptoms, with night sweats.  Followed by Dr Janeece Riggers in [place], [facility] - On Duloxetine 30mg  daily (for years), Bu  Admits drinks caffeine. Admits increased caffeine intake when travels / on these weekends. No substances see  below  History of ovarian cysts in past.  Imaging, has had Head CT in 2014, unremarkable.  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 She has done well since eliminating this.   Health Maintenance:  UTD Pap smear by [facility] 12/24/19 negative NILM no high risk HPV, repeat 3-5 year   Depression screen [facility] 2/9 06/27/2020 07/10/2015 03/28/2015  Decreased Interest 0 0 0  Down, Depressed, Hopeless 0 0 0  PHQ - 2 Score 0 0 0   GAD 7 : Generalized Anxiety Score 06/27/2020  Nervous, Anxious, on Edge 0  Control/stop worrying 0  Worry too much - different things 0  Trouble relaxing 1  Restless 0  Easily annoyed or irritable 0  Afraid - awful might happen 0  Total GAD 7 Score 1  Anxiety Difficulty Not difficult at all       Social History   Tobacco Use  . Smoking status: Never Smoker  . Smokeless tobacco: Never Used  Vaping Use  . Vaping Use: Never used  Substance Use Topics  . Alcohol use: No  . Drug use: No    Review of Systems Per HPI unless specifically indicated above     Objective:    BP 120/66   Pulse 100   Temp 98.2 F (36.8 C) (Temporal)   Resp 16   Ht 5\' 7"  (1.702 m)  Wt 159 lb (72.1 kg)   SpO2 100%   BMI 24.90 kg/m   Wt Readings from Last 3 Encounters:  06/27/20 159 lb (72.1 kg)  04/24/18 203 lb 11.2 oz (92.4 kg)  04/21/18 208 lb (94.3 kg)    Physical Exam Vitals and nursing note reviewed.  Constitutional:      General: She is not in acute distress.    Appearance: She is well-developed. She is not diaphoretic.     Comments: Well-appearing, comfortable, cooperative  HENT:     Head: Normocephalic and atraumatic.  Eyes:     General:        Right eye: No discharge.        Left eye: No discharge.     Conjunctiva/sclera:  Conjunctivae normal.     Pupils: Pupils are equal, round, and reactive to light.  Neck:     Thyroid: No thyromegaly.  Cardiovascular:     Rate and Rhythm: Normal rate and regular rhythm.     Heart sounds: Normal heart sounds. No murmur heard.   Pulmonary:     Effort: Pulmonary effort is normal. No respiratory distress.     Breath sounds: Normal breath sounds. No wheezing or rales.  Abdominal:     General: Bowel sounds are normal. There is no distension.     Palpations: Abdomen is soft. There is no mass.     Tenderness: There is no abdominal tenderness.  Musculoskeletal:        General: No tenderness. Normal range of motion.     Cervical back: Normal range of motion and neck supple.     Comments: Upper / Lower Extremities: - Normal muscle tone, strength bilateral upper extremities 5/5, lower extremities 5/5  Lymphadenopathy:     Cervical: No cervical adenopathy.  Skin:    General: Skin is warm and dry.     Findings: No erythema or rash.  Neurological:     Mental Status: She is alert and oriented to person, place, and time.     Comments: Distal sensation intact to light touch all extremities  Psychiatric:        Behavior: Behavior normal.     Comments: Well groomed, good eye contact, normal speech and thoughts       IGP, Aptima HPV - [facility]   Ref Range & Units 6 mo ago Comments  DIAGNOSIS: - [facility]  Comment  NEGATIVE FOR INTRAEPITHELIAL LESION OR MALIGNANCY.      Specimen adequacy: - [facility]  Comment  Satisfactory for evaluation. Endocervical and/or squamous metaplastic  cells (endocervical component) are present.      Clinician provided ICD10: - [facility]  Comment  Z01.419  Z11.51      PERFORMED BY: - [facility]  Comment  Daphene Jaeger, Cytotechnologist (ASCP)      . - [facility]  .        Note: - [facility]  Comment  The Pap smear is a screening test designed to aid in the detection of  premalignant and malignant conditions of the uterine cervix. It is not a    diagnostic procedure and should not be used as the sole means of detecting  cervical cancer. Both false-positive and false-negative reports do occur.      Test Methodology: - [facility]  Comment  This liquid based ThinPrep(R) pap test was screened with the  use of an image guided system.      HPV APTIMA - [facility] Negative  Negative  This nucleic acid amplification test detects fourteen high-risk  HPV types (16,18,31,33,35,39,45,51,52,56,58,59,66,68) without  differentiation.      Resulting Agency  [facility]   Narrative Performed by [facility] Performed at: [address] - [facility] [place]  [address], [place], [place] [id number]  Lab Director: Jolene SchimkeSanjai Nagendra MD, Phone: [phone]  Performed at: [address] - [facility] [place] [facility]  [address], [place], [place] [id number]  Lab Director: Jolene SchimkeSanjai Nagendra MD, Phone: [phone]  Specimen Comment: No. of containers.Marland [email or web address] ThinPrep Vial Specimen Collected: 12/20/19 4:06 PM Last Resulted: 12/24/19 9:35 AM  Received From: [facility]  Result Received: 06/27/20 2:30 PM     Results for orders placed or performed during the hospital encounter of 04/24/18  Basic metabolic panel  Result Value Ref Range   Sodium 136 135 - 145 mmol/L   Potassium 4.1 3.5 - 5.1 mmol/L   Chloride 106 98 - 111 mmol/L   CO2 22 22 - 32 mmol/L   Glucose, Bld 102 (H) 70 - 99 mg/dL   BUN 9 6 - 20 mg/dL   Creatinine, Ser 4.400.93 0.44 - 1.00 mg/dL   Calcium 8.7 (L) 8.9 - 10.3 mg/dL   GFR calc non Af Amer >60 >60 mL/min   GFR calc Af Amer >60 >60 mL/min   Anion gap 8 5 - 15  CBC  Result Value Ref Range   WBC 4.3 3.6 - 11.0 K/uL   RBC 4.88 3.80 - 5.20 MIL/uL   Hemoglobin 13.9 12.0 - 16.0 g/dL   HCT 10.241.3 35 - 47 %   MCV 84.6 80.0 - 100.0 fL   MCH 28.4 26.0 - 34.0 pg   MCHC 33.6 32.0 - 36.0 g/dL   RDW 72.513.5 36.611.5 - 44.014.5 %   Platelets 234 150 - 440 K/uL  Pregnancy, urine POC  Result Value Ref Range   Preg Test, Ur NEGATIVE NEGATIVE  Type and  screen [facility]  Result Value Ref Range   ABO/RH(D) O POS    Antibody Screen NEG    Sample Expiration      04/27/2018 Performed at [facility], [address] [place], [place] [address]   ABO/Rh  Result Value Ref Range   ABO/RH(D)      O POS Performed at [facility], [address] [place], [place] [address]   Surgical pathology  Result Value Ref Range   SURGICAL PATHOLOGY      Surgical Pathology CASE: [id number] PATIENT: Kely Perrow Surgical Pathology Report     SPECIMEN SUBMITTED: A. Endometrial curettings  CLINICAL HISTORY: None provided  PRE-OPERATIVE DIAGNOSIS: Thickened endometrium  POST-OPERATIVE DIAGNOSIS: Same as pre-op     DIAGNOSIS: A. ENDOMETRIUM; CURETTAGE: - FRAGMENTS CONSISTENT WITH ENDOMETRIAL POLYP. - PROLIFERATIVE ENDOMETRIUM WITH EXTENSIVE BREAKDOWN. - FRAGMENTS OF MYOMETRIAL SMOOTH MUSCLE. - FRAGMENTS OF ENDOCERVICAL TISSUE WITH FOCAL TUBAL METAPLASIA AND CHRONIC INFLAMMATION. - NEGATIVE FOR ATYPIA AND MALIGNANCY.   GROSS DESCRIPTION: A. Labeled: Endometrial curettings Received: In formalin Tissue fragment(s): Multiple Size: Aggregate, 5.5 x 2.5 x 0.2 cm Description: Blood clot and purple tissue fragments Entirely submitted in 3 cassettes.   Final Diagnosis performed by Ronald LoboMary Olney, MD.   Electronically signed 04/26/2018 12:40:10PM The electronic signature indicates that the named Attending P athologist has evaluated the specimen  Technical component performed at [facility], [address], [place], [place] [address] Lab: [phone] Dir: Jolene SchimkeSanjai Nagendra, MD, MMM  Professional component performed at [facility], [facility], [address], [place], [place] [address] Lab: [phone] Dir: Georgiann Cockerara C. Oneita Krasubinas, MD  Assessment & Plan:   Problem List Items Addressed This Visit    None    Visit Diagnoses    Menopausal vasomotor syndrome    -  Primary   Episodic  lightheadedness       Intermittent palpitations         Currently asymptomatic, without flare currently  Complex episodic clinical presentation with long history >20+ years of rare infrequent episodic spells of sweating, dizziness, palpitations or similar symptoms. Historically very infrequent not even yearly.  Now significant worsening with inc frequency and severity x 4 episode within 1 year.  Last episode severe lasting >3 hours and residual symptoms after.  Uncertain exact etiology, given the sweating episodes associated, seems most likely hormonal cause as she has described diagnosed of Primary Ovarian Insufficiency and vasomotor symptoms she is awaiting to return to GYN to discuss HRT  Also considered hypoglycemia symptomatic concerns, given this report in past - has not documented hypoglycemia but has association with travel, fatigue, and poor PO intake when has episodes usually prompt resolution with PO intake and rest.  Prior labs within past 2019-2021 reviewed on CareEverywhere Has had TSH negative in 2019  Today we discussed further diagnostic plan. Recommend that she pursue discussion with HRT with her GYN next.  In future, can consider Endocrinology consultation if thought to have other underlying hormonal cause of these sweating / palpitation episodes.  Additionally - she mentions remote history of heart murmur, not appreciated on my exam today. Question also if possible Mitral valve prolapse could be consideration with a syndrome. May reconsider Cardiology / Heart Monitor for palpitations vs ECHO in future if still unable to determine cause.  Follow-up, seek care sooner if needed for acute episode  No orders of the defined types were placed in this encounter.     Follow up plan: Return in about 3 months (around 09/26/2020) for 3 month follow-up after GYN hormone / episodes.   Saralyn Pilar, DO [facility] 06/27/2020,  3:37 PM

## 2020-06-27 NOTE — Patient Instructions (Addendum)
Thank you for coming to the office today.  Keep in touch  If you need the Endocrinology referral, let me know, or discuss next time.  Possibly hypoglycemic events - try to be mindful of this  Unlikely anxiety provoked  Rarely would be cardiac or arrhythmia - in future we can consider a Zio monitor patch or referral to Heart doctor.   Please schedule a Follow-up Appointment to: Return in about 3 months (around 09/26/2020) for 3 month follow-up after GYN hormone / episodes.  If you have any other questions or concerns, please feel free to call the office or send a message through MyChart. You may also schedule an earlier appointment if necessary.  Additionally, you may be receiving a survey about your experience at our office within a few days to 1 week by e-mail or mail. We value your feedback.  Saralyn Pilar, DO [facility], [place]

## 2020-07-11 DIAGNOSIS — N951 Menopausal and female climacteric states: Secondary | ICD-10-CM | POA: Diagnosis not present

## 2020-09-17 DIAGNOSIS — N390 Urinary tract infection, site not specified: Secondary | ICD-10-CM | POA: Diagnosis not present

## 2020-10-02 DIAGNOSIS — F411 Generalized anxiety disorder: Secondary | ICD-10-CM | POA: Diagnosis not present

## 2020-10-02 DIAGNOSIS — F339 Major depressive disorder, recurrent, unspecified: Secondary | ICD-10-CM | POA: Diagnosis not present

## 2020-10-03 ENCOUNTER — Ambulatory Visit: Payer: BC Managed Care – PPO | Admitting: Family Medicine

## 2020-10-10 ENCOUNTER — Other Ambulatory Visit: Payer: Self-pay

## 2020-10-10 ENCOUNTER — Ambulatory Visit (INDEPENDENT_AMBULATORY_CARE_PROVIDER_SITE_OTHER): Payer: BC Managed Care – PPO | Admitting: Family Medicine

## 2020-10-10 ENCOUNTER — Encounter: Payer: Self-pay | Admitting: Family Medicine

## 2020-10-10 ENCOUNTER — Other Ambulatory Visit: Payer: Self-pay | Admitting: Family Medicine

## 2020-10-10 VITALS — BP 108/75 | HR 75 | Temp 97.7°F | Resp 16 | Ht 67.0 in | Wt 162.0 lb

## 2020-10-10 DIAGNOSIS — L292 Pruritus vulvae: Secondary | ICD-10-CM | POA: Diagnosis not present

## 2020-10-10 DIAGNOSIS — Z1159 Encounter for screening for other viral diseases: Secondary | ICD-10-CM

## 2020-10-10 DIAGNOSIS — Z6825 Body mass index (BMI) 25.0-25.9, adult: Secondary | ICD-10-CM | POA: Diagnosis not present

## 2020-10-10 DIAGNOSIS — N951 Menopausal and female climacteric states: Secondary | ICD-10-CM

## 2020-10-10 DIAGNOSIS — Z8371 Family history of colonic polyps: Secondary | ICD-10-CM

## 2020-10-10 DIAGNOSIS — Z0000 Encounter for general adult medical examination without abnormal findings: Secondary | ICD-10-CM

## 2020-10-10 DIAGNOSIS — N898 Other specified noninflammatory disorders of vagina: Secondary | ICD-10-CM | POA: Diagnosis not present

## 2020-10-10 DIAGNOSIS — N939 Abnormal uterine and vaginal bleeding, unspecified: Secondary | ICD-10-CM | POA: Diagnosis not present

## 2020-10-10 DIAGNOSIS — R7309 Other abnormal glucose: Secondary | ICD-10-CM

## 2020-10-10 NOTE — Progress Notes (Signed)
Subjective:    Patient ID: Kayla Figueroa, female    DOB: 1981/03/23, 39 y.o.   MRN: [id number]  Kayla Figueroa is a 39 y.o. female presenting on 10/10/2020 for Dizziness (follow-up after GYN hormone / episodes---getting improved now after hormonal therapy as per patient and hypoglycemia is improved /)   HPI   Follow-up Dizziness / Lightheaded Episodes / Vasomotor Symptoms Last visit with me 06/27/20 see note, discussion on variety of constellation symptoms and ultimately diagnosed in past with Primary Ovarian Insufficiency. She was referred to GYN For HRT - Update now established with [facility], seen in 07/11/20 started on HRT  Estradiol-Norethindrone  0.5 -0.1 mg daily Improvement overall, no further dizziness episodes.  Overall beneficial in reducing vasomotor symptoms night sweats, but still has some residual symptoms including vaginal dryness etc. States that overall improvement, symptoms are "better" but not resolved. Now already Follow-up GYN visit today already, they are discussing other dosing and med options. And are looking for new option for her different higher dose formulation if needed   Health Maintenance:  Colon CA Screening: Never had colonoscopy. Currently asymptomatic except some constipation functional. Known family history of colon POLYPS (multiple - from father age 69s unsure which age precisely at this time) and no fam history of colon cancer. Due for screening test age 88+ unless father had high number polyps age 57 or earlier.   Depression screen [facility] 2/9 06/27/2020 07/10/2015 03/28/2015  Decreased Interest 0 0 0  Down, Depressed, Hopeless 0 0 0  PHQ - 2 Score 0 0 0    Social History   Tobacco Use  . Smoking status: Never Smoker  . Smokeless tobacco: Never Used  Vaping Use  . Vaping Use: Never used  Substance Use Topics  . Alcohol use: No  . Drug use: No    Review of Systems Per HPI unless specifically indicated above     Objective:    BP 108/75    Pulse 75   Temp 97.7 F (36.5 C) (Temporal)   Resp 16   Ht 5\' 7"  (1.702 m)   Wt 162 lb (73.5 kg)   SpO2 100%   BMI 25.37 kg/m   Wt Readings from Last 3 Encounters:  10/10/20 162 lb (73.5 kg)  06/27/20 159 lb (72.1 kg)  04/24/18 203 lb 11.2 oz (92.4 kg)    Physical Exam Vitals and nursing note reviewed.  Constitutional:      General: She is not in acute distress.    Appearance: She is well-developed and well-nourished. She is not diaphoretic.     Comments: Well-appearing, comfortable, cooperative  HENT:     Head: Normocephalic and atraumatic.     Mouth/Throat:     Mouth: Oropharynx is clear and moist.  Eyes:     General:        Right eye: No discharge.        Left eye: No discharge.     Conjunctiva/sclera: Conjunctivae normal.  Cardiovascular:     Rate and Rhythm: Normal rate.  Pulmonary:     Effort: Pulmonary effort is normal.  Musculoskeletal:        General: No edema.  Skin:    General: Skin is warm and dry.     Findings: No erythema or rash.  Neurological:     Mental Status: She is alert and oriented to person, place, and time.  Psychiatric:        Mood and Affect: Mood and affect normal.  Behavior: Behavior normal.     Comments: Well groomed, good eye contact, normal speech and thoughts       Results for orders placed or performed in visit on 06/27/20  HM PAP SMEAR  Result Value Ref Range   HM Pap smear Negative for intraepithelial lesion or malignancy       Assessment & Plan:   Problem List Items Addressed This Visit   None   Visit Diagnoses    Menopausal vasomotor syndrome    -  Primary   Family history of colonic polyps       BMI 25.0-25.9,adult          Clinically with menopausal vasomotor syndrome on HRT now about 50% improve overall, some areas much improved. Also no further constellation of dizziness/lightheaded symptoms now - Followed by [facility]YN, on HRT - They will work on adjusting her regimen to further improve symptoms  now.  #FM Hx colon polyps Father in age 24s- unsure precise age most likely with colon polyps on prior colonoscopy also complication with diverticulitis. Now concern for when to start early colon CA screening - discussion today that normal starting age now is 49+, however if father had multiple polyps age 76 or younger she may be eligible for earlier colonoscopy. - She will look into this and if need we can refer to GI for considering coverage of early colonoscopy screening, otherwise wait until age 56+  No orders of the defined types were placed in this encounter.     Follow up plan: Return in about 6 months (around 04/10/2021) for 6  month fasting lab only then 1 week later Annual Physical.  Future labs ordered for 04/10/21   Saralyn Pilar, DO [facility] 10/10/2020, 10:51 AM

## 2020-10-10 NOTE — Patient Instructions (Addendum)
Thank you for coming to the office today.  Check into Father's age when diagnosed with polyps - find out if a very large number polyps (>5+) or if it is just 1-3 or 4 polyps may not actually increase your risk. Some genetic conditions for polyps that increase risk of colon cancer can cause dozens of polyps and that is a higher risk condition.  Typically age 39+ for colonoscopy or cologuard, should be covered by insurance regardless of family history  If you want to try to pursue colonoscopy or screening at age 77+ then let me know with all of that info and we can consider refer to GI or ask GI if it will work.  DUE for FASTING BLOOD WORK (no food or drink after midnight before the lab appointment, only water or coffee without cream/sugar on the morning of)  SCHEDULE "Lab Only" visit in the morning at the clinic for lab draw in 6 MONTHS   - Make sure Lab Only appointment is at about 1 week before your next appointment, so that results will be available  For Lab Results, once available within 2-3 days of blood draw, you can can log in to MyChart online to view your results and a brief explanation. Also, we can discuss results at next follow-up visit.   Please schedule a Follow-up Appointment to: Return in about 6 months (around 04/10/2021) for 6  month fasting lab only then 1 week later Annual Physical.  If you have any other questions or concerns, please feel free to call the office or send a message through MyChart. You may also schedule an earlier appointment if necessary.  Additionally, you may be receiving a survey about your experience at our office within a few days to 1 week by e-mail or mail. We value your feedback.  Saralyn Pilar, DO [facility], [place]

## 2020-10-30 DIAGNOSIS — N939 Abnormal uterine and vaginal bleeding, unspecified: Secondary | ICD-10-CM | POA: Diagnosis not present

## 2020-10-30 LAB — TSH: TSH: 6.8 — AB (ref <=5.90)

## 2020-11-21 ENCOUNTER — Ambulatory Visit (INDEPENDENT_AMBULATORY_CARE_PROVIDER_SITE_OTHER): Payer: BC Managed Care – PPO | Admitting: Family Medicine

## 2020-11-21 ENCOUNTER — Other Ambulatory Visit: Payer: Self-pay

## 2020-11-21 ENCOUNTER — Encounter: Payer: Self-pay | Admitting: Family Medicine

## 2020-11-21 VITALS — BP 124/78 | HR 106 | Ht 67.0 in | Wt 162.2 lb

## 2020-11-21 DIAGNOSIS — E039 Hypothyroidism, unspecified: Secondary | ICD-10-CM

## 2020-11-21 DIAGNOSIS — L739 Follicular disorder, unspecified: Secondary | ICD-10-CM | POA: Diagnosis not present

## 2020-11-21 MED ORDER — MUPIROCIN 2 % EX OINT: 1.0000 "application " | TOPICAL_OINTMENT | Freq: Two times a day (BID) | CUTANEOUS | 1 refills | Status: DC | PRN

## 2020-11-21 NOTE — Progress Notes (Signed)
Subjective:    Patient ID: Kayla Figueroa, female    DOB: 1981-03-31, 40 y.o.   MRN: [id number]  Kayla Figueroa is a 40 y.o. female presenting on 11/21/2020 for Warts (Been there a couple of months )   HPI   Hypothyroidism Last lab from GYN showed lab with elevated TSH 6.7 in 10/2020. She has had constellation of symptoms. - She was started on Levothyroxine daily and will return for lab check. May feel better, needs repeat lab.  History lichen planus, has triamcinolone with improvement.  Spot on Back Unsure timeline of when spot, incidental finding with spot on back. She wanted to get it checked out. She had spot on her nose that felt like a regular zit, and it does not go away. Still feels a bump on nose    Depression screen [facility] 2/9 06/27/2020 07/10/2015 03/28/2015  Decreased Interest 0 0 0  Down, Depressed, Hopeless 0 0 0  PHQ - 2 Score 0 0 0    Social History   Tobacco Use  . Smoking status: Never Smoker  . Smokeless tobacco: Never Used  Vaping Use  . Vaping Use: Never used  Substance Use Topics  . Alcohol use: No  . Drug use: No    Review of Systems Per HPI unless specifically indicated above     Objective:    BP 124/78   Pulse (!) 106   Ht 5\' 7"  (1.702 m)   Wt 162 lb 3.2 oz (73.6 kg)   SpO2 100%   BMI 25.40 kg/m   Wt Readings from Last 3 Encounters:  11/21/20 162 lb 3.2 oz (73.6 kg)  10/10/20 162 lb (73.5 kg)  06/27/20 159 lb (72.1 kg)    Physical Exam Vitals and nursing note reviewed.  Constitutional:      General: She is not in acute distress.    Appearance: She is well-developed and well-nourished. She is not diaphoretic.     Comments: Well-appearing, comfortable, cooperative  HENT:     Head: Normocephalic and atraumatic.     Mouth/Throat:     Mouth: Oropharynx is clear and moist.  Eyes:     General:        Right eye: No discharge.        Left eye: No discharge.     Conjunctiva/sclera: Conjunctivae normal.  Cardiovascular:     Rate  and Rhythm: Normal rate.  Pulmonary:     Effort: Pulmonary effort is normal.  Musculoskeletal:        General: No edema.  Skin:    General: Skin is warm and dry.     Findings: Lesion (Right low back with inflamed scaly slightly raised lesion with some healing. no ulceration, no pigmented lesion) present. No erythema or rash.     Comments: Left lower inner aspect of nare with slight nodularity with mild inflammation small sore inner nare  Neurological:     Mental Status: She is alert and oriented to person, place, and time.  Psychiatric:        Mood and Affect: Mood and affect normal.        Behavior: Behavior normal.     Comments: Well groomed, good eye contact, normal speech and thoughts    Results for orders placed or performed in visit on 11/21/20  TSH  Result Value Ref Range   TSH 6.80 (A) 0.41 - 5.90      Assessment & Plan:   Problem List Items Addressed This Visit  Acquired hypothyroidism - Primary   Relevant Medications   levothyroxine (SYNTHROID) 50 MCG tablet    Other Visit Diagnoses    Folliculitis       Relevant Medications   mupirocin ointment (BACTROBAN) 2 %      Repeat thyroid panel in about 6 weeks Adjust levothyroxine dose as indicated  Lesion Spot on back Appears inflammatory, may be lichen planus related Trial topical Triamincolone rx if resolves, can monitor, if unresolve can refer to Dermatology  Use mupirocin on nare   Meds ordered this encounter  Medications  . mupirocin ointment (BACTROBAN) 2 %    Sig: Apply 1 application topically 2 (two) times daily as needed. For up to 2 weeks at a time. Apply to nose    Dispense:  30 g    Refill:  1      Follow up plan: Return in about 5 weeks (around 12/26/2020) for 5 weeks early March 2022 for fasting lab only - Thyroid Panel only - keep other apts in June.  Future labs ordered for 12/2020 for lab only thyroid, f/u if indicated   Kayla Pilar, DO [facility] 11/21/2020, 11:25 AM

## 2020-11-21 NOTE — Patient Instructions (Addendum)
Thank you for coming to the office today.  Use Triamcinolone on back twice a day for 2-4 week trial or longer if it is successful.  Use Mupirocin on nose.  Mild elevated TSH 6.7  Keep on current dosage of Levothyroxine daily  We can check thyroid panel and follow up with the results.  May schedule Virtual phone call few days after lab results, to discuss the skin issues and thyroid if questions and if want a referral to Dermatologist.  Message [place] 1-2 days before upcoming March Labs to coordinate  DUE for FASTING BLOOD WORK (no food or drink after midnight before the lab appointment, only water or coffee without cream/sugar on the morning of)  SCHEDULE "Lab Only" visit in the morning at the clinic for lab draw in 5 WEEKS   - Make sure Lab Only appointment is at about 1 week before your next appointment, so that results will be available  For Lab Results, once available within 2-3 days of blood draw, you can can log in to MyChart online to view your results and a brief explanation. Also, we can discuss results at next follow-up visit.    Please schedule a Follow-up Appointment to: Return in about 5 weeks (around 12/26/2020) for 5 weeks early March 2022 for fasting lab only - Thyroid Panel only - keep other apts in June.  If you have any other questions or concerns, please feel free to call the office or send a message through MyChart. You may also schedule an earlier appointment if necessary.  Additionally, you may be receiving a survey about your experience at our office within a few days to 1 week by e-mail or mail. We value your feedback.  Saralyn Pilar, DO [facility], [place]

## 2020-12-25 ENCOUNTER — Other Ambulatory Visit: Payer: Self-pay | Admitting: *Deleted

## 2020-12-25 DIAGNOSIS — Z0000 Encounter for general adult medical examination without abnormal findings: Secondary | ICD-10-CM

## 2020-12-25 DIAGNOSIS — Z1159 Encounter for screening for other viral diseases: Secondary | ICD-10-CM

## 2020-12-25 DIAGNOSIS — N951 Menopausal and female climacteric states: Secondary | ICD-10-CM

## 2020-12-25 DIAGNOSIS — E039 Hypothyroidism, unspecified: Secondary | ICD-10-CM

## 2020-12-25 DIAGNOSIS — R7309 Other abnormal glucose: Secondary | ICD-10-CM

## 2020-12-26 ENCOUNTER — Other Ambulatory Visit: Payer: BC Managed Care – PPO

## 2020-12-26 ENCOUNTER — Other Ambulatory Visit: Payer: Self-pay

## 2020-12-26 DIAGNOSIS — R7309 Other abnormal glucose: Secondary | ICD-10-CM | POA: Diagnosis not present

## 2020-12-26 DIAGNOSIS — N951 Menopausal and female climacteric states: Secondary | ICD-10-CM | POA: Diagnosis not present

## 2020-12-26 DIAGNOSIS — Z1159 Encounter for screening for other viral diseases: Secondary | ICD-10-CM | POA: Diagnosis not present

## 2020-12-26 DIAGNOSIS — E039 Hypothyroidism, unspecified: Secondary | ICD-10-CM | POA: Diagnosis not present

## 2020-12-27 LAB — THYROID PANEL WITH TSH
Free Thyroxine Index: 3 (ref 1.4–3.8)
T3 Uptake: 22 % (ref 22–35)
T4, Total: 13.5 ug/dL — ABNORMAL HIGH (ref 5.1–11.9)
TSH: 3.76 mIU/L

## 2020-12-30 ENCOUNTER — Other Ambulatory Visit: Payer: Self-pay | Admitting: Family Medicine

## 2020-12-30 DIAGNOSIS — Z1159 Encounter for screening for other viral diseases: Secondary | ICD-10-CM

## 2020-12-30 DIAGNOSIS — E039 Hypothyroidism, unspecified: Secondary | ICD-10-CM

## 2020-12-30 DIAGNOSIS — N951 Menopausal and female climacteric states: Secondary | ICD-10-CM

## 2020-12-30 DIAGNOSIS — R7309 Other abnormal glucose: Secondary | ICD-10-CM

## 2020-12-30 DIAGNOSIS — Z0000 Encounter for general adult medical examination without abnormal findings: Secondary | ICD-10-CM

## 2020-12-30 DIAGNOSIS — Z6825 Body mass index (BMI) 25.0-25.9, adult: Secondary | ICD-10-CM

## 2021-01-20 MED ORDER — LEVOTHYROXINE SODIUM 50 MCG PO TABS: 50.0000 ug | ORAL_TABLET | Freq: Every day | ORAL | 3 refills | Status: DC

## 2021-01-20 NOTE — Addendum Note (Signed)
Addended by: [name] on: 01/20/2021 12:48 PM   Modules accepted: Orders

## 2021-02-13 DIAGNOSIS — Z872 Personal history of diseases of the skin and subcutaneous tissue: Secondary | ICD-10-CM | POA: Diagnosis not present

## 2021-02-13 DIAGNOSIS — Z1231 Encounter for screening mammogram for malignant neoplasm of breast: Secondary | ICD-10-CM | POA: Diagnosis not present

## 2021-02-13 DIAGNOSIS — E2839 Other primary ovarian failure: Secondary | ICD-10-CM | POA: Diagnosis not present

## 2021-02-13 DIAGNOSIS — Z01419 Encounter for gynecological examination (general) (routine) without abnormal findings: Secondary | ICD-10-CM | POA: Diagnosis not present

## 2021-02-28 DIAGNOSIS — J0100 Acute maxillary sinusitis, unspecified: Secondary | ICD-10-CM | POA: Diagnosis not present

## 2021-04-02 DIAGNOSIS — F411 Generalized anxiety disorder: Secondary | ICD-10-CM | POA: Diagnosis not present

## 2021-04-02 DIAGNOSIS — F339 Major depressive disorder, recurrent, unspecified: Secondary | ICD-10-CM | POA: Diagnosis not present

## 2021-04-10 ENCOUNTER — Other Ambulatory Visit: Payer: BC Managed Care – PPO

## 2021-04-17 ENCOUNTER — Encounter: Payer: BC Managed Care – PPO | Admitting: Family Medicine

## 2021-04-24 ENCOUNTER — Other Ambulatory Visit: Payer: Self-pay

## 2021-04-24 ENCOUNTER — Other Ambulatory Visit: Payer: BC Managed Care – PPO

## 2021-04-24 DIAGNOSIS — Z1159 Encounter for screening for other viral diseases: Secondary | ICD-10-CM

## 2021-04-24 DIAGNOSIS — Z6825 Body mass index (BMI) 25.0-25.9, adult: Secondary | ICD-10-CM

## 2021-04-24 DIAGNOSIS — Z0000 Encounter for general adult medical examination without abnormal findings: Secondary | ICD-10-CM | POA: Diagnosis not present

## 2021-04-24 DIAGNOSIS — E039 Hypothyroidism, unspecified: Secondary | ICD-10-CM

## 2021-04-24 DIAGNOSIS — R7309 Other abnormal glucose: Secondary | ICD-10-CM | POA: Diagnosis not present

## 2021-04-24 DIAGNOSIS — N951 Menopausal and female climacteric states: Secondary | ICD-10-CM

## 2021-04-28 LAB — HEMOGLOBIN A1C
Hgb A1c MFr Bld: 4.8 % of total Hgb (ref <5.7)
Mean Plasma Glucose: 91 mg/dL
eAG (mmol/L): 5 mmol/L

## 2021-04-28 LAB — COMPREHENSIVE METABOLIC PANEL
AG Ratio: 1.7 (calc) (ref 1.0–2.5)
ALT: 11 U/L (ref 6–29)
AST: 14 U/L (ref 10–30)
Albumin: 4 g/dL (ref 3.6–5.1)
Alkaline phosphatase (APISO): 42 U/L (ref 31–125)
BUN: 12 mg/dL (ref 7–25)
CO2: 24 mmol/L (ref 20–32)
Calcium: 9.3 mg/dL (ref 8.6–10.2)
Chloride: 102 mmol/L (ref 98–110)
Creat: 0.97 mg/dL (ref 0.50–1.10)
Globulin: 2.4 g/dL (calc) (ref 1.9–3.7)
Glucose, Bld: 81 mg/dL (ref 65–99)
Potassium: 4.2 mmol/L (ref 3.5–5.3)
Sodium: 137 mmol/L (ref 135–146)
Total Bilirubin: 0.7 mg/dL (ref 0.2–1.2)
Total Protein: 6.4 g/dL (ref 6.1–8.1)

## 2021-04-28 LAB — CBC WITH DIFFERENTIAL/PLATELET
Absolute Monocytes: 320 cells/uL (ref 200–950)
Basophils Absolute: 38 cells/uL (ref 0–200)
Basophils Relative: 0.8 %
Eosinophils Absolute: 28 cells/uL (ref 15–500)
Eosinophils Relative: 0.6 %
HCT: 46 % — ABNORMAL HIGH (ref 35.0–45.0)
Hemoglobin: 15.2 g/dL (ref 11.7–15.5)
Lymphs Abs: 1889 cells/uL (ref 850–3900)
MCH: 29.4 pg (ref 27.0–33.0)
MCHC: 33 g/dL (ref 32.0–36.0)
MCV: 89 fL (ref 80.0–100.0)
MPV: 11.5 fL (ref 7.5–12.5)
Monocytes Relative: 6.8 %
Neutro Abs: 2425 cells/uL (ref 1500–7800)
Neutrophils Relative %: 51.6 %
Platelets: 227 10*3/uL (ref 140–400)
RBC: 5.17 10*6/uL — ABNORMAL HIGH (ref 3.80–5.10)
RDW: 12.2 % (ref 11.0–15.0)
Total Lymphocyte: 40.2 %
WBC: 4.7 10*3/uL (ref 3.8–10.8)

## 2021-04-28 LAB — LIPID PANEL
Cholesterol: 313 mg/dL — ABNORMAL HIGH (ref <200)
HDL: 70 mg/dL (ref >=50)
LDL Cholesterol (Calc): 220 mg/dL (calc) — ABNORMAL HIGH
Non-HDL Cholesterol (Calc): 243 mg/dL (calc) — ABNORMAL HIGH (ref <130)
Total CHOL/HDL Ratio: 4.5 (calc) (ref <5.0)
Triglycerides: 98 mg/dL (ref <150)

## 2021-04-28 LAB — HEPATITIS C ANTIBODY
Hepatitis C Ab: NONREACTIVE
SIGNAL TO CUT-OFF: 0 (ref <1.00)

## 2021-04-28 LAB — TSH: TSH: 3.35 mIU/L

## 2021-04-28 LAB — T4, FREE: Free T4: 1.2 ng/dL (ref 0.8–1.8)

## 2021-05-01 ENCOUNTER — Encounter: Payer: BC Managed Care – PPO | Admitting: Family Medicine

## 2021-05-26 ENCOUNTER — Encounter: Payer: Self-pay | Admitting: Family Medicine

## 2021-05-26 ENCOUNTER — Other Ambulatory Visit: Payer: Self-pay

## 2021-05-26 ENCOUNTER — Ambulatory Visit (INDEPENDENT_AMBULATORY_CARE_PROVIDER_SITE_OTHER): Payer: BC Managed Care – PPO | Admitting: Family Medicine

## 2021-05-26 VITALS — BP 116/77 | HR 112 | Ht 67.0 in | Wt 165.0 lb

## 2021-05-26 DIAGNOSIS — E039 Hypothyroidism, unspecified: Secondary | ICD-10-CM

## 2021-05-26 DIAGNOSIS — E7849 Other hyperlipidemia: Secondary | ICD-10-CM | POA: Insufficient documentation

## 2021-05-26 DIAGNOSIS — Z0000 Encounter for general adult medical examination without abnormal findings: Secondary | ICD-10-CM

## 2021-05-26 NOTE — Assessment & Plan Note (Signed)
Chronic stable hypothyroidism Continue Levothyroxine daily

## 2021-05-26 NOTE — Progress Notes (Signed)
Subjective:    Patient ID: Kayla Figueroa, female    DOB: 09-17-1981, 40 y.o.   MRN: [id number]  Kayla Figueroa is a 40 y.o. female presenting on 05/26/2021 for Annual Exam   HPI  Here for Annual Physical and Lab Review.  Familial HYPERLIPIDEMIA: Last lipid panel 04/2021, significantly elevated LDL 220. Normal HDL and TG. Never on medication previously Known fam history of hyperlipidemia Prior history lab from 2020 with LDL calculated 118 Lifestyle - Diet: low cholesterol diet, mostly veggie, not eating   Hypothyroidism Last lab from GYN showed lab with elevated TSH 6.7 in 10/2020. She has had constellation of symptoms. Last lab normal T4 and TSH Continues on Levothyroxine 50mcg daily   Additional topics  Recurrence of rash - tried triamcinolone topical with some relief, then it returned. Several scattered spots on trunk. Some itchy symptoms, interested in Dermatology   Health Maintenance:  COVID19 vaccine, completed initial series and booster Pfizer 08/22/20. - She had chronic fatigue in 2013. She describes issue with this problem back then, she asks about long covid in future if at risk.   Colon CA Screening: Never had colonoscopy. Currently asymptomatic except some constipation functional. Known family history of colon POLYPS (multiple - from father age 8050s unsure which age precisely at this time) and no fam history of colon cancer. Due for screening test age 40+ unless father had high number polyps age 40 or earlier.   [facility] 05/26/2021 05/26/2021 06/27/2020  Decreased Interest 0 0 0  Down, Depressed, Hopeless 0 0 0  PHQ - 2 Score 0 0 0  Altered sleeping 3 3 -  Tired, decreased energy 1 1 -  Change in appetite 0 0 -  Feeling bad or failure about yourself  0 0 -  Trouble concentrating 0 0 -  Moving slowly or fidgety/restless 0 0 -  Suicidal thoughts 0 0 -  PHQ-9 Score 4 4 -  Difficult doing work/chores Not difficult at all Not difficult at all -     Past Medical History:  Diagnosis Date   Allergy    Anxiety    Chronic fatigue    Depression    GERD (gastroesophageal reflux disease)    RARE   Heart murmur    present in high school--no longer present   Past Surgical History:  Procedure Laterality Date   HYSTEROSCOPY WITH D & C N/A 04/24/2018   Procedure: DILATATION AND CURETTAGE /HYSTEROSCOPY;  Surgeon: Ward, Elenora Fenderhelsea C, MD;  Location: [facility] ORS;  Service: Gynecology;  Laterality: N/A;   TONSILLECTOMY AND ADENOIDECTOMY  2005   as an adult   Social History   Socioeconomic History   Marital status: Married    Spouse name: Not on file   Number of children: Not on file   Years of education: Not on file   Highest education level: Not on file  Occupational History   Not on file  Tobacco Use   Smoking status: Never   Smokeless tobacco: Never  Vaping Use   Vaping Use: Never used  Substance and Sexual Activity   Alcohol use: No   Drug use: No   Sexual activity: Yes    Partners: Male    Birth control/protection: Pill    Comment: Junel  Other Topics Concern   Not on file  Social History Narrative   Not on file   Social Determinants of Health   Financial Resource Strain: Not on file  Food Insecurity: Not on file  Transportation Needs:  Not on file  Physical Activity: Not on file  Stress: Not on file  Social Connections: Not on file  Intimate Partner Violence: Not on file   Family History  Problem Relation Age of Onset   Hyperlipidemia Father    Colon polyps Father    Diverticulosis Father        has had bowel resection for diverticulitis   Osteoporosis Mother    Hypothyroidism Mother    Osteoporosis Maternal Grandmother    Current Outpatient Medications on File Prior to Visit  Medication Sig   Bacillus Coagulans-Inulin (PROBIOTIC) 1-250 BILLION-MG CAPS    buPROPion (WELLBUTRIN SR) 100 MG 12 hr tablet Take 1 tablet (100 mg total) by mouth every morning.   calcium carbonate (OS-CAL) 600 MG TABS Take 600 mg by  mouth every evening.    cetirizine (ZYRTEC) 10 MG tablet Take 1 tablet (10 mg total) by mouth daily. (Patient taking differently: Take 10 mg by mouth every morning.)   DULoxetine (CYMBALTA) 30 MG capsule Take 30 mg by mouth every morning.    KELNOR 1/50 1-50 MG-MCG tablet Take 1 tablet by mouth daily.   levothyroxine (SYNTHROID) 50 MCG tablet Take 1 tablet (50 mcg total) by mouth daily before breakfast.   Melatonin 3 MG TABS Take 3 mg by mouth at bedtime as needed (for sleep.).    Multiple Vitamins-Minerals (MULTIVITAMIN ADULTS PO) Take by mouth.   mupirocin ointment (BACTROBAN) 2 % Apply 1 application topically 2 (two) times daily as needed. For up to 2 weeks at a time. Apply to nose   Omega-3 Fatty Acids (FISH OIL) 1000 MG CAPS    triamcinolone ointment (KENALOG) 0.5 %    No current facility-administered medications on file prior to visit.    Review of Systems  Constitutional:  Negative for activity change, appetite change, chills, diaphoresis, fatigue and fever.  HENT:  Negative for congestion and hearing loss.   Eyes:  Negative for visual disturbance.  Respiratory:  Negative for cough, chest tightness, shortness of breath and wheezing.   Cardiovascular:  Negative for chest pain, palpitations and leg swelling.  Gastrointestinal:  Negative for abdominal pain, constipation, diarrhea, nausea and vomiting.  Genitourinary:  Negative for dysuria, frequency and hematuria.  Musculoskeletal:  Negative for arthralgias and neck pain.  Skin:  Positive for rash.  Allergic/Immunologic: Negative for environmental allergies.  Neurological:  Negative for dizziness, weakness, light-headedness, numbness and headaches.  Hematological:  Negative for adenopathy.  Psychiatric/Behavioral:  Negative for behavioral problems, dysphoric mood and sleep disturbance.   Per HPI unless specifically indicated above     Objective:    BP 116/77   Pulse (!) 112   Ht 5\' 7"  (1.702 m)   Wt 165 lb (74.8 kg)   SpO2  100%   BMI 25.84 kg/m   Wt Readings from Last 3 Encounters:  05/26/21 165 lb (74.8 kg)  11/21/20 162 lb 3.2 oz (73.6 kg)  10/10/20 162 lb (73.5 kg)    Physical Exam Vitals and nursing note reviewed.  Constitutional:      General: She is not in acute distress.    Appearance: She is well-developed. She is not diaphoretic.     Comments: Well-appearing, comfortable, cooperative  HENT:     Head: Normocephalic and atraumatic.  Eyes:     General:        Right eye: No discharge.        Left eye: No discharge.     Conjunctiva/sclera: Conjunctivae normal.     Pupils:  Pupils are equal, round, and reactive to light.  Neck:     Thyroid: No thyromegaly.  Cardiovascular:     Rate and Rhythm: Normal rate and regular rhythm.     Pulses: Normal pulses.     Heart sounds: Normal heart sounds. No murmur heard. Pulmonary:     Effort: Pulmonary effort is normal. No respiratory distress.     Breath sounds: Normal breath sounds. No wheezing or rales.  Abdominal:     General: Bowel sounds are normal. There is no distension.     Palpations: Abdomen is soft. There is no mass.     Tenderness: There is no abdominal tenderness.  Musculoskeletal:        General: No tenderness. Normal range of motion.     Cervical back: Normal range of motion and neck supple.     Comments: Upper / Lower Extremities: - Normal muscle tone, strength bilateral upper extremities 5/5, lower extremities 5/5  Lymphadenopathy:     Cervical: No cervical adenopathy.  Skin:    General: Skin is warm and dry.     Findings: Rash (several scattered spots on trunk and back with mild erythematous maculopapular rash) present. No erythema.  Neurological:     Mental Status: She is alert and oriented to person, place, and time.     Comments: Distal sensation intact to light touch all extremities  Psychiatric:        Mood and Affect: Mood normal.        Behavior: Behavior normal.        Thought Content: Thought content normal.      Comments: Well groomed, good eye contact, normal speech and thoughts      Results for orders placed or performed in visit on 04/24/21  HgB A1c  Result Value Ref Range   Hgb A1c MFr Bld 4.8 <5.7 % of total Hgb   Mean Plasma Glucose 91 mg/dL   eAG (mmol/L) 5.0 mmol/L  CBC with Differential  Result Value Ref Range   WBC 4.7 3.8 - 10.8 Thousand/uL   RBC 5.17 (H) 3.80 - 5.10 Million/uL   Hemoglobin 15.2 11.7 - 15.5 g/dL   HCT 67.8 (H) 93.8 - 10.1 %   MCV 89.0 80.0 - 100.0 fL   MCH 29.4 27.0 - 33.0 pg   MCHC 33.0 32.0 - 36.0 g/dL   RDW 75.1 02.5 - 85.2 %   Platelets 227 140 - 400 Thousand/uL   MPV 11.5 7.5 - 12.5 fL   Neutro Abs 2,425 1,500 - 7,800 cells/uL   Lymphs Abs 1,889 850 - 3,900 cells/uL   Absolute Monocytes 320 200 - 950 cells/uL   Eosinophils Absolute 28 15 - 500 cells/uL   Basophils Absolute 38 0 - 200 cells/uL   Neutrophils Relative % 51.6 %   Total Lymphocyte 40.2 %   Monocytes Relative 6.8 %   Eosinophils Relative 0.6 %   Basophils Relative 0.8 %  Comprehensive Metabolic Panel (CMET)  Result Value Ref Range   Glucose, Bld 81 65 - 99 mg/dL   BUN 12 7 - 25 mg/dL   Creat 7.78 2.42 - 3.53 mg/dL   BUN/Creatinine Ratio NOT APPLICABLE 6 - 22 (calc)   Sodium 137 135 - 146 mmol/L   Potassium 4.2 3.5 - 5.3 mmol/L   Chloride 102 98 - 110 mmol/L   CO2 24 20 - 32 mmol/L   Calcium 9.3 8.6 - 10.2 mg/dL   Total Protein 6.4 6.1 - 8.1 g/dL   Albumin 4.0  3.6 - 5.1 g/dL   Globulin 2.4 1.9 - 3.7 g/dL (calc)   AG Ratio 1.7 1.0 - 2.5 (calc)   Total Bilirubin 0.7 0.2 - 1.2 mg/dL   Alkaline phosphatase (APISO) 42 31 - 125 U/L   AST 14 10 - 30 U/L   ALT 11 6 - 29 U/L  Lipid panel  Result Value Ref Range   Cholesterol 313 (H) <200 mg/dL   HDL 70 > OR = 50 mg/dL   Triglycerides 98 <161 mg/dL   LDL Cholesterol (Calc) 220 (H) mg/dL (calc)   Total CHOL/HDL Ratio 4.5 <5.0 (calc)   Non-HDL Cholesterol (Calc) 243 (H) <130 mg/dL (calc)  Hepatitis C Antibody  Result Value Ref Range    Hepatitis C Ab NON-REACTIVE NON-REACTIVE   SIGNAL TO CUT-OFF 0.00 <1.00  TSH  Result Value Ref Range   TSH 3.35 mIU/L  T4, free  Result Value Ref Range   Free T4 1.2 0.8 - 1.8 ng/dL      Assessment & Plan:   Problem List Items Addressed This Visit     Familial hyperlipidemia    Concern for suspected familial hyperlipidemia Fam history elevated lipids LDL on last lab fasting 220, very concerning today for this condition Prior lab result from 2020 LDL calculated 110 Healthy mostly low cholesterol diet, does not seem to be only dietary  Plan Discussed treatment options - we reviewed medication options with Statin therapy trial vs PCSK9 therapy / vs referral to Adv Lipid Clinic.  We can review options shortly after patient has a chance to review these and determine which route to go. May also reconsider a repeat test in 3 months before med if she prefers, given prior result was actually in normal range.  Follow-up as planned, she can message sooner and we can place order when ready.       Acquired hypothyroidism    Chronic stable hypothyroidism Continue Levothyroxine daily       Other Visit Diagnoses     Annual physical exam    -  Primary       Updated Health Maintenance information Reviewed recent lab results with patient Encouraged improvement to lifestyle with diet and exercise Goal of weight loss  #Rash Chronic problem, can refer to Dermatology when ready  No orders of the defined types were placed in this encounter.   Follow up plan: Return in about 1 year (around 05/26/2022) for 1 year fasting lab only then 1 week later Annual Physical.  Saralyn Pilar, DO [facility] 05/26/2021, 2:03 PM

## 2021-05-26 NOTE — Patient Instructions (Addendum)
Thank you for coming to the office today.  Please look into the Familal Hyperlipidemia condition further - Recommendations would include trial of a Statin therapy if interested to start slowly with a standard treatment, otherwise if interested In the more advanced option - can check insurance about coverage cost on the PCSK9 inhibitor injections - Repatha or Praluent.  Also can consider the Advanced Lipid Clinic - Cardiologist Dr Rennis Golden in [facility]) they can help manage genetic cholesterol issues very well.  Lipid Panel     Component Value Date/Time   CHOL 313 (H) 04/24/2021 0826   TRIG 98 04/24/2021 0826   HDL 70 04/24/2021 0826   CHOLHDL 4.5 04/24/2021 0826   LDLCALC 220 (H) 04/24/2021 0826   Familial Hyperlipidemia - E78.49  -------------------------------------  Let me know in future if you want to pursue the referral to lipid clinic or start with medications.  For rash consider dermatology  DERMATOLOGY  [facility] Dermatologist in [place], [place] Address: [address], [place], [place] [address] Phone: [phone]  [facility]   [address] [place], [place] [address] Hours: 8AM-5PM Phone: [phone]  [facility] [address] [place], [place] [address] Phone: [phone]   Please schedule a Follow-up Appointment to: Return in about 1 year (around 05/26/2022) for 1 year fasting lab only then 1 week later Annual Physical.  If you have any other questions or concerns, please feel free to call the office or send a message through MyChart. You may also schedule an earlier appointment if necessary.  Additionally, you may be receiving a survey about your experience at our office within a few days to 1 week by e-mail or mail. We value your feedback.  Saralyn Pilar, DO [facility], [place]

## 2021-05-26 NOTE — Assessment & Plan Note (Signed)
Concern for suspected familial hyperlipidemia Fam history elevated lipids LDL on last lab fasting 220, very concerning today for this condition Prior lab result from 2020 LDL calculated 110 Healthy mostly low cholesterol diet, does not seem to be only dietary  Plan Discussed treatment options - we reviewed medication options with Statin therapy trial vs PCSK9 therapy / vs referral to Adv Lipid Clinic.  We can review options shortly after patient has a chance to review these and determine which route to go. May also reconsider a repeat test in 3 months before med if she prefers, given prior result was actually in normal range.  Follow-up as planned, she can message sooner and we can place order when ready.

## 2021-06-02 ENCOUNTER — Encounter: Payer: Self-pay | Admitting: Family Medicine

## 2021-06-02 DIAGNOSIS — R21 Rash and other nonspecific skin eruption: Secondary | ICD-10-CM

## 2021-06-03 DIAGNOSIS — R21 Rash and other nonspecific skin eruption: Secondary | ICD-10-CM | POA: Diagnosis not present

## 2021-06-09 DIAGNOSIS — L821 Other seborrheic keratosis: Secondary | ICD-10-CM | POA: Diagnosis not present

## 2021-06-09 DIAGNOSIS — L42 Pityriasis rosea: Secondary | ICD-10-CM | POA: Diagnosis not present

## 2021-09-04 DIAGNOSIS — Z1231 Encounter for screening mammogram for malignant neoplasm of breast: Secondary | ICD-10-CM | POA: Diagnosis not present

## 2021-09-04 LAB — HM MAMMOGRAPHY

## 2021-09-29 DIAGNOSIS — F411 Generalized anxiety disorder: Secondary | ICD-10-CM | POA: Diagnosis not present

## 2021-11-13 ENCOUNTER — Other Ambulatory Visit: Payer: Self-pay

## 2021-11-13 ENCOUNTER — Telehealth: Payer: Self-pay | Admitting: Family Medicine

## 2021-11-13 DIAGNOSIS — E039 Hypothyroidism, unspecified: Secondary | ICD-10-CM

## 2021-11-13 MED ORDER — LEVOTHYROXINE SODIUM 50 MCG PO TABS: 50.0000 ug | ORAL_TABLET | Freq: Every day | ORAL | 3 refills | Status: DC

## 2021-11-13 NOTE — Telephone Encounter (Signed)
Filled and sent to [facility].

## 2021-11-13 NOTE — Telephone Encounter (Signed)
Medication Refill - Medication: levothyroxine (SYNTHROID) 50 MCG tablet  Has the patient contacted their pharmacy? Yes.   (Agent: If no, request that the patient contact the pharmacy for the refill. If patient does not wish to contact the pharmacy document the reason why and proceed with request.) (Agent: If yes, when and what did the pharmacy advise?)  Preferred Pharmacy (with phone number or street name): [facility]/ will have to fax RX. Not in system [address] [place] [address] Fax:[phone] Phone:[phone]  Has the patient been seen for an appointment in the last year OR does the patient have an upcoming appointment? Yes.  06/15/21  Agent: Please be advised that RX refills may take up to 3 business days. We ask that you follow-up with your pharmacy.

## 2021-11-18 ENCOUNTER — Encounter: Payer: Self-pay | Admitting: Family Medicine

## 2021-11-18 DIAGNOSIS — E7849 Other hyperlipidemia: Secondary | ICD-10-CM

## 2021-11-25 ENCOUNTER — Other Ambulatory Visit: Payer: Self-pay

## 2021-11-25 ENCOUNTER — Other Ambulatory Visit: Payer: BC Managed Care – PPO

## 2021-11-25 DIAGNOSIS — E7849 Other hyperlipidemia: Secondary | ICD-10-CM | POA: Diagnosis not present

## 2021-11-26 LAB — LIPID PANEL
Cholesterol: 260 mg/dL — ABNORMAL HIGH (ref <200)
HDL: 57 mg/dL (ref >=50)
LDL Cholesterol (Calc): 176 mg/dL (calc) — ABNORMAL HIGH
Non-HDL Cholesterol (Calc): 203 mg/dL (calc) — ABNORMAL HIGH (ref <130)
Total CHOL/HDL Ratio: 4.6 (calc) (ref <5.0)
Triglycerides: 133 mg/dL (ref <150)

## 2021-11-26 LAB — LDL CHOLESTEROL, DIRECT: Direct LDL: 195 mg/dL — ABNORMAL HIGH (ref <100)

## 2021-11-30 ENCOUNTER — Other Ambulatory Visit: Payer: Self-pay | Admitting: Family Medicine

## 2021-11-30 DIAGNOSIS — E7849 Other hyperlipidemia: Secondary | ICD-10-CM

## 2022-01-05 ENCOUNTER — Telehealth: Payer: Self-pay | Admitting: Internal Medicine

## 2022-01-05 ENCOUNTER — Ambulatory Visit (HOSPITAL_BASED_OUTPATIENT_CLINIC_OR_DEPARTMENT_OTHER): Payer: BC Managed Care – PPO | Admitting: Internal Medicine

## 2022-01-05 ENCOUNTER — Other Ambulatory Visit: Payer: Self-pay

## 2022-01-05 ENCOUNTER — Encounter (HOSPITAL_BASED_OUTPATIENT_CLINIC_OR_DEPARTMENT_OTHER): Payer: Self-pay | Admitting: Internal Medicine

## 2022-01-05 VITALS — BP 110/76 | HR 92 | Ht 67.0 in | Wt 164.1 lb

## 2022-01-05 DIAGNOSIS — E7801 Familial hypercholesterolemia: Secondary | ICD-10-CM | POA: Diagnosis not present

## 2022-01-05 DIAGNOSIS — E7800 Pure hypercholesterolemia, unspecified: Secondary | ICD-10-CM | POA: Diagnosis not present

## 2022-01-05 MED ORDER — ATORVASTATIN CALCIUM 40 MG PO TABS: 40.0000 mg | ORAL_TABLET | Freq: Every day | ORAL | 3 refills | Status: DC

## 2022-01-05 NOTE — Patient Instructions (Addendum)
Medication Instructions:  ?START atorvastatin 40mg  daily ? ?*If you need a refill on your cardiac medications before your next appointment, please call your pharmacy* ? ? ?Lab Work: ?FASTING lab work in about 3-4 months with Dr.  ?-- complete about week before next appointment  ?If you have labs (blood work) drawn today and your tests are completely normal, you will receive your results only by: ?MyChart Message (if you have MyChart) OR ?A paper copy in the mail ?If you have any lab test that is abnormal or we need to change your treatment, we will call you to review the results. ? ? ?Testing/Procedures: ?NONE ? ? ?Follow-Up: ?At [facility], you and your health needs are our priority.  As part of our continuing mission to provide you with exceptional heart care, we have created designated Provider Care Teams.  These Care Teams include your primary Cardiologist (physician) and Advanced Practice Providers (APPs -  Physician Assistants and Nurse Practitioners) who all work together to provide you with the care you need, when you need it. ? ?We recommend signing up for the patient portal called "MyChart".  Sign up information is provided on this After Visit Summary.  MyChart is used to connect with patients for Virtual Visits (Telemedicine).  Patients are able to view lab/test results, encounter notes, upcoming appointments, etc.  Non-urgent messages can be sent to your provider as well.   ?To learn more about what you can do with MyChart, go to [facility].   ? ?Your next appointment:   ?3-4 months with Dr. [email or web address] -- lipid clinic ? ?

## 2022-01-05 NOTE — Progress Notes (Signed)
? ? ?LIPID CLINIC CONSULT NOTE ? ?Chief Complaint:  ?Possible familial hyperlipidemia ? ?Primary Care Physician: ?[name], DO ? ?Primary Cardiologist:  ?None ? ?HPI:  ?Kayla Figueroa is a 41 y.o. female who is being seen today for the evaluation of familial hyperlipidemia at the request of Saralyn Pilar *.  This is a pleasant 41 year old female kindly referred for evaluation management of familial hyperlipidemia.  She has recently had very high cholesterol despite a near 50 pound weight loss and changes in her diet and activity.  Lipid testing about 8 months ago showed total cholesterol 313, HDL 70, triglycerides 98 and LDL 220.  She was also found to be hypothyroid and started on treatment.  Recent testing more than 6 months later has shown improvement in her lipids with total cholesterol 260, HDL 57, triglycerides 133 and LDL 176.  She does report high cholesterol in her father but no noted early onset heart disease.  She has no history of early onset heart disease.  She has no traditional cardiac risk factors such as hypertension, diabetes, smoking, etc.  She works for [employer] in [place] but had previously achieved a PhD in microbiology at [employer]. ? ?PMHx:  ?Past Medical History:  ?Diagnosis Date  ? Allergy   ? Anxiety   ? Chronic fatigue   ? Depression   ? GERD (gastroesophageal reflux disease)   ? RARE  ? Heart murmur   ? present in high school--no longer present  ? ? ?Past Surgical History:  ?Procedure Laterality Date  ? HYSTEROSCOPY WITH D & C N/A 04/24/2018  ? Procedure: DILATATION AND CURETTAGE /HYSTEROSCOPY;  Surgeon: Ward, Elenora Fender, MD;  Location: [facility] ORS;  Service: Gynecology;  Laterality: N/A;  ? TONSILLECTOMY AND ADENOIDECTOMY  2005  ? as an adult  ? ? ?FAMHx:  ?Family History  ?Problem Relation Age of Onset  ? Hyperlipidemia Father   ? Colon polyps Father   ? Diverticulosis Father   ?     has had bowel resection for diverticulitis  ? Osteoporosis Mother   ?  Hypothyroidism Mother   ? Osteoporosis Maternal Grandmother   ? ? ?SOCHx:  ? reports that she has never smoked. She has never used smokeless tobacco. She reports that she does not drink alcohol and does not use drugs. ? ?ALLERGIES:  ?Allergies  ?Allergen Reactions  ? Mangifera Indica Fruit Ext (Mango) [Mangifera Indica]   ? Sulfa Antibiotics Hives  ? ? ?ROS: ?Pertinent items noted in HPI and remainder of comprehensive ROS otherwise negative. ? ?HOME MEDS: ?Current Outpatient Medications on File Prior to Visit  ?Medication Sig Dispense Refill  ? Bacillus Coagulans-Inulin (PROBIOTIC) 1-250 BILLION-MG CAPS     ? buPROPion (WELLBUTRIN SR) 100 MG 12 hr tablet Take 1 tablet (100 mg total) by mouth every morning.    ? calcium carbonate (OS-CAL) 600 MG TABS Take 600 mg by mouth every evening.     ? cetirizine (ZYRTEC) 10 MG tablet Take 1 tablet (10 mg total) by mouth daily. (Patient taking differently: Take 10 mg by mouth every morning.) 30 tablet 11  ? DULoxetine (CYMBALTA) 30 MG capsule Take 30 mg by mouth every morning.   0  ? KELNOR 1/50 1-50 MG-MCG tablet Take 1 tablet by mouth daily.    ? levothyroxine (SYNTHROID) 50 MCG tablet Take 1 tablet (50 mcg total) by mouth daily before breakfast. 90 tablet 3  ? Melatonin 3 MG TABS Take 3 mg by mouth at bedtime as needed (  for sleep.).     ? Multiple Vitamins-Minerals (MULTIVITAMIN ADULTS PO) Take by mouth.    ? mupirocin ointment (BACTROBAN) 2 % Apply 1 application topically 2 (two) times daily as needed. For up to 2 weeks at a time. Apply to nose 30 g 1  ? Omega-3 Fatty Acids (FISH OIL) 1000 MG CAPS     ? triamcinolone ointment (KENALOG) 0.5 %     ? ?No current facility-administered medications on file prior to visit.  ? ? ?LABS/IMAGING: ?No results found for this or any previous visit (from the past 48 hour(s)). ?No results found. ? ?LIPID PANEL: ?   ?Component Value Date/Time  ? CHOL 260 (H) 11/25/2021 0809  ? TRIG 133 11/25/2021 0809  ? HDL 57 11/25/2021 0809  ? CHOLHDL  4.6 11/25/2021 0809  ? LDLCALC 176 (H) 11/25/2021 0809  ? LDLDIRECT 195 (H) 11/25/2021 0809  ? ? ?WEIGHTS: ?Wt Readings from Last 3 Encounters:  ?01/05/22 164 lb 1.6 oz (74.4 kg)  ?05/26/21 165 lb (74.8 kg)  ?11/21/20 162 lb 3.2 oz (73.6 kg)  ? ? ?VITALS: ?BP 110/76   Pulse 92   Ht 5\' 7"  (1.702 m)   Wt 164 lb 1.6 oz (74.4 kg)   SpO2 99%   BMI 25.70 kg/m?  ? ?EXAM: ?General appearance: alert and no distress ?Neck: no carotid bruit, no JVD, and thyroid not enlarged, symmetric, no tenderness/mass/nodules ?Lungs: clear to auscultation bilaterally ?Heart: regular rate and rhythm, S1, S2 normal, no murmur, click, rub or gallop ?Abdomen: soft, non-tender; bowel sounds normal; no masses,  no organomegaly ?Extremities: extremities normal, atraumatic, no cyanosis or edema ?Pulses: 2+ and symmetric ?Skin: Skin color, texture, turgor normal. No rashes or lesions ?Neurologic: Grossly normal ?Psych: Pleasant ? ?*Examination chaperoned by , RN. ? ?EKG: ?Deferred ? ?ASSESSMENT: ?Mixed dyslipidemia with LDL greater than 190, possible familial hyperlipidemia ?Hypothyroidism ? ?PLAN: ?1.   Ms. Kossman has a mixed dyslipidemia with LDL greater than 190, possibly familial hyperlipidemia as her father had high cholesterol however no early onset heart disease.  We discussed genetic testing today and she seemed agreeable to that.  She has had some improvement in her lipids with treatment of her hypothyroidism.  There also was significant weight loss previously which may have played a role in her cholesterol.  I think she is a good candidate for statin therapy would recommend atorvastatin 40 mg daily.  We will go ahead and check a lipid NMR and LP(a) in 3 to 4 months on therapy and plan follow-up at that time. ? ?Thanks again for the kind referral. ? ?Veleta Miners, MD, [facility], FACP  ?[place]  [facility]  ?Medical Director of the Advanced Lipid Disorders &  ?Cardiovascular Risk Reduction Clinic ?Diplomate of the  [facility] of Clinical Lipidology ?Attending Cardiologist  ?Direct Dial: [phone]  Fax: [phone]  ?Website:  [email or web address] ? ?[phone] Amen Staszak ?01/05/2022, 4:41 PM  ?

## 2022-01-05 NOTE — Telephone Encounter (Signed)
Genetic test for dyslipidemia/ASCVD ordered ([facility]) ?Cheek swab completed in office ?Specimen and necessary paperwork mailed. ?ID: [id number] ? ?

## 2022-01-06 ENCOUNTER — Encounter (HOSPITAL_BASED_OUTPATIENT_CLINIC_OR_DEPARTMENT_OTHER): Payer: Self-pay | Admitting: Internal Medicine

## 2022-01-14 ENCOUNTER — Other Ambulatory Visit: Payer: Self-pay | Admitting: Family Medicine

## 2022-01-14 DIAGNOSIS — E039 Hypothyroidism, unspecified: Secondary | ICD-10-CM

## 2022-01-15 NOTE — Telephone Encounter (Signed)
Requested Prescriptions  ?Pending Prescriptions Disp Refills  ?? levothyroxine (SYNTHROID) 50 MCG tablet [Pharmacy Med Name: LEVOTHYROXINE 0.05MG  ( ) TAB] 90 tablet 2  ?  Sig: TAKE 1 TABLET BY MOUTH DAILY BEFORE BREAKFAST  ?  ? Endocrinology:  Hypothyroid Agents Passed - 01/14/2022  9:25 AM  ?  ?  Passed - TSH in normal range and within 360 days  ?  TSH  ?Date Value Ref Range Status  ?04/24/2021 3.35 mIU/L Final  ?  Comment:  ?            Reference Range ?. ?          > or = 20 Years  0.40-4.50 ?. ?               Pregnancy Ranges ?          First trimester    0.26-2.66 ?          Second trimester   0.55-2.73 ?          Third trimester    0.43-2.91 ?  ?   ?  ?  Passed - Valid encounter within last 12 months  ?  Recent Outpatient Visits   ?      ? 7 months ago Annual physical exam  ? [facility] [name], DO  ? 1 year ago Acquired hypothyroidism  ? [facility] [place], [name], DO  ? 1 year ago Menopausal vasomotor syndrome  ? [facility], [name], DO  ? 1 year ago Menopausal vasomotor syndrome  ? [facility] [place], [name], DO  ? 5 years ago Angioedema of lips, initial encounter  ? [facility] [name], DO  ?  ?  ?Future Appointments   ?        ? In 5 months Hilty, Lisette Abu, MD [facility]  ?  ? ?  ?  ?  ? ?

## 2022-01-29 ENCOUNTER — Encounter (HOSPITAL_BASED_OUTPATIENT_CLINIC_OR_DEPARTMENT_OTHER): Payer: Self-pay | Admitting: Internal Medicine

## 2022-03-12 DIAGNOSIS — Z01419 Encounter for gynecological examination (general) (routine) without abnormal findings: Secondary | ICD-10-CM | POA: Diagnosis not present

## 2022-04-09 ENCOUNTER — Ambulatory Visit: Payer: Self-pay

## 2022-04-09 NOTE — Telephone Encounter (Signed)
  Chief Complaint: HA, fatigue, low fever, muscle spasms Symptoms: ibid Frequency: a few days Pertinent Negatives: Patient denies SOB, High fever,  Disposition: [] ED /[] Urgent Care (no appt availability in office) / [] Appointment(In office/virtual)/ []  [facility]/ [x] Home Care/ [] Refused Recommended Disposition /[] [place] [facility]/ []  Follow-up with PCP Additional Notes: Pt has had s/s for a few days and an exposure to COVID. PT will isolate for 5 days and mask. PT has taken several home COVID tests that were negative, pt may go to pharmacy for PCR. PT will follow up with provider if needed.  Summary: COvid advice   Pt was exposed to COVID and had a sore throat but is still testing negative for COVID, pt is concerned and needed advice since she is not sure if she should still be isolating.      Reason for Disposition  [1] COVID-19 infection suspected by caller or triager AND [2] mild symptoms (cough, fever, or others) AND [3] negative COVID-19 rapid test  Answer Assessment - Initial Assessment Questions 1. COVID-19 DIAGNOSIS: "Who made your COVID-19 diagnosis?" "Was it confirmed by a positive lab test or self-test?" If not diagnosed by a doctor (or NP/PA), ask "Are there lots of cases (community spread) where you live?" Note: See public health department website, if unsure.     Not diagnosed 2. COVID-19 EXPOSURE: "Was there any known exposure to COVID before the symptoms began?" CDC Definition of close contact: within 6 feet (2 meters) for a total of 15 minutes or more over a 24-hour period.      yes 3. ONSET: "When did the COVID-19 symptoms start?"      Several days ago 4. WORST SYMPTOM: "What is your worst symptom?" (e.g., cough, fever, shortness of breath, muscle aches)     HA, fatigue,muscle spasms 5. COUGH: "Do you have a cough?" If Yes, ask: "How bad is the cough?"       no 6. FEVER: "Do you have a fever?" If Yes, ask: "What is your temperature, how was it measured,  and when did it start?"     99 7. RESPIRATORY STATUS: "Describe your breathing?" (e.g., shortness of breath, wheezing, unable to speak)    8. BETTER-SAME-WORSE: "Are you getting better, staying the same or getting worse compared to yesterday?"  If getting worse, ask, "In what way?"     Worse - low fever 9. HIGH RISK DISEASE: "Do you have any chronic medical problems?" (e.g., asthma, heart or lung disease, weak immune system, obesity, etc.)     High cholesterol - hypothyroid 10. VACCINE: "Have you had the COVID-19 vaccine?" If Yes, ask: "Which one, how many shots, when did you get it?"       yes 11. BOOSTER: "Have you received your COVID-19 booster?" If Yes, ask: "Which one and when did you get it?"       yes 12. PREGNANCY: "Is there any chance you are pregnant?" "When was your last menstrual period?"        13. OTHER SYMPTOMS: "Do you have any other symptoms?"  (e.g., chills, fatigue, headache, loss of smell or taste, muscle pain, sore throat)       HA, sore throat, fatigue 14. O2 SATURATION MONITOR:  "Do you use an oxygen saturation monitor (pulse oximeter) at home?" If Yes, ask "What is your reading (oxygen level) today?" "What is your usual oxygen saturation reading?" (e.g., 95%)  Protocols used: Coronavirus (COVID-19) Diagnosed or Suspected-A-AH

## 2022-05-04 ENCOUNTER — Encounter (HOSPITAL_BASED_OUTPATIENT_CLINIC_OR_DEPARTMENT_OTHER): Payer: Self-pay | Admitting: Internal Medicine

## 2022-05-04 ENCOUNTER — Encounter: Payer: Self-pay | Admitting: Family Medicine

## 2022-06-11 ENCOUNTER — Ambulatory Visit (HOSPITAL_BASED_OUTPATIENT_CLINIC_OR_DEPARTMENT_OTHER): Payer: BLUE CROSS/BLUE SHIELD | Admitting: Internal Medicine

## 2022-06-15 DIAGNOSIS — E7849 Other hyperlipidemia: Secondary | ICD-10-CM | POA: Diagnosis not present

## 2022-06-16 LAB — NMR, LIPOPROFILE
Cholesterol, Total: 175 mg/dL (ref 100–199)
HDL Particle Number: 45.4 umol/L (ref >=30.5)
HDL-C: 57 mg/dL (ref >39)
LDL Particle Number: 1439 nmol/L — ABNORMAL HIGH (ref <1000)
LDL Size: 21.1 nm (ref >20.5)
LDL-C (NIH Calc): 100 mg/dL — ABNORMAL HIGH (ref 0–99)
LP-IR Score: 44 (ref <=45)
Small LDL Particle Number: 762 nmol/L — ABNORMAL HIGH (ref <=527)
Triglycerides: 101 mg/dL (ref 0–149)

## 2022-06-16 LAB — LIPOPROTEIN A (LPA): Lipoprotein (a): <8.4 nmol/L (ref <75.0)

## 2022-06-22 ENCOUNTER — Encounter (HOSPITAL_BASED_OUTPATIENT_CLINIC_OR_DEPARTMENT_OTHER): Payer: Self-pay | Admitting: Internal Medicine

## 2022-06-22 ENCOUNTER — Ambulatory Visit (HOSPITAL_BASED_OUTPATIENT_CLINIC_OR_DEPARTMENT_OTHER): Payer: BC Managed Care – PPO | Admitting: Internal Medicine

## 2022-06-22 VITALS — BP 125/82 | HR 111 | Ht 67.0 in | Wt 166.0 lb

## 2022-06-22 DIAGNOSIS — E7849 Other hyperlipidemia: Secondary | ICD-10-CM

## 2022-06-22 DIAGNOSIS — E038 Other specified hypothyroidism: Secondary | ICD-10-CM | POA: Diagnosis not present

## 2022-06-22 DIAGNOSIS — E7800 Pure hypercholesterolemia, unspecified: Secondary | ICD-10-CM

## 2022-06-22 NOTE — Patient Instructions (Signed)
Medication Instructions:  NO CHANGES  *If you need a refill on your cardiac medications before your next appointment, please call your pharmacy*   Lab Work: FASTING lab work before next visit to check cholesterol   If you have labs (blood work) drawn today and your tests are completely normal, you will receive your results only by: MyChart Message (if you have MyChart) OR A paper copy in the mail If you have any lab test that is abnormal or we need to change your treatment, we will call you to review the results.   Follow-Up: At [facility], you and your health needs are our priority.  As part of our continuing mission to provide you with exceptional heart care, we have created designated Provider Care Teams.  These Care Teams include your primary Cardiologist (physician) and Advanced Practice Providers (APPs -  Physician Assistants and Nurse Practitioners) who all work together to provide you with the care you need, when you need it.  We recommend signing up for the patient portal called "MyChart".  Sign up information is provided on this After Visit Summary.  MyChart is used to connect with patients for Virtual Visits (Telemedicine).  Patients are able to view lab/test results, encounter notes, upcoming appointments, etc.  Non-urgent messages can be sent to your provider as well.   To learn more about what you can do with MyChart, go to [email or web address].    Your next appointment:    3-4 months with Dr. Rennis Golden

## 2022-06-22 NOTE — Progress Notes (Signed)
LIPID CLINIC CONSULT NOTE  Chief Complaint:  Possible familial hyperlipidemia  Primary Care Physician: [name], DO  Primary Cardiologist:  None  HPI:  Kayla Figueroa is a 41 y.o. female who is being seen today for the evaluation of familial hyperlipidemia at the request of Saralyn Pilar *.  This is a pleasant 41 year old female kindly referred for evaluation management of familial hyperlipidemia.  She has recently had very high cholesterol despite a near 50 pound weight loss and changes in her diet and activity.  Lipid testing about 8 months ago showed total cholesterol 313, HDL 70, triglycerides 98 and LDL 220.  She was also found to be hypothyroid and started on treatment.  Recent testing more than 6 months later has shown improvement in her lipids with total cholesterol 260, HDL 57, triglycerides 133 and LDL 176.  She does report high cholesterol in her father but no noted early onset heart disease.  She has no history of early onset heart disease.  She has no traditional cardiac risk factors such as hypertension, diabetes, smoking, etc.  She works for [employer] in [place] but had previously achieved a PhD in microbiology at [employer].  PMHx:  Past Medical History:  Diagnosis Date   Allergy    Anxiety    Chronic fatigue    Depression    GERD (gastroesophageal reflux disease)    RARE   Heart murmur    present in high school--no longer present    Past Surgical History:  Procedure Laterality Date   HYSTEROSCOPY WITH D & C N/A 04/24/2018   Procedure: DILATATION AND CURETTAGE /HYSTEROSCOPY;  Surgeon: Ward, Elenora Fender, MD;  Location: [facility] ORS;  Service: Gynecology;  Laterality: N/A;   TONSILLECTOMY AND ADENOIDECTOMY  2005   as an adult    FAMHx:  Family History  Problem Relation Age of Onset   Hyperlipidemia Father    Colon polyps Father    Diverticulosis Father        has had bowel resection for diverticulitis   Osteoporosis Mother     Hypothyroidism Mother    Osteoporosis Maternal Grandmother     SOCHx:   reports that she has never smoked. She has never used smokeless tobacco. She reports that she does not drink alcohol and does not use drugs.  ALLERGIES:  Allergies  Allergen Reactions   Mangifera Indica Fruit Ext (Mango) [Mangifera Indica]    Sulfa Antibiotics Hives    ROS: Pertinent items noted in HPI and remainder of comprehensive ROS otherwise negative.  HOME MEDS: Current Outpatient Medications on File Prior to Visit  Medication Sig Dispense Refill   Bacillus Coagulans-Inulin (PROBIOTIC) 1-250 BILLION-MG CAPS      buPROPion (WELLBUTRIN SR) 100 MG 12 hr tablet Take 1 tablet (100 mg total) by mouth every morning.     calcium carbonate (OS-CAL) 600 MG TABS Take 600 mg by mouth every evening.      cetirizine (ZYRTEC) 10 MG tablet Take 1 tablet (10 mg total) by mouth daily. (Patient taking differently: Take 10 mg by mouth every morning.) 30 tablet 11   DULoxetine (CYMBALTA) 30 MG capsule Take 30 mg by mouth every morning.   0   KELNOR 1/50 1-50 MG-MCG tablet Take 1 tablet by mouth daily.     levothyroxine (SYNTHROID) 50 MCG tablet TAKE 1 TABLET BY MOUTH DAILY BEFORE BREAKFAST 90 tablet 2   Melatonin 3 MG TABS Take 3 mg by mouth at bedtime as needed (for sleep.).  Multiple Vitamins-Minerals (MULTIVITAMIN ADULTS PO) Take by mouth.     Omega-3 Fatty Acids (FISH OIL) 1000 MG CAPS      triamcinolone ointment (KENALOG) 0.5 %      atorvastatin (LIPITOR) 40 MG tablet Take 1 tablet (40 mg total) by mouth daily. 90 tablet 3   No current facility-administered medications on file prior to visit.    LABS/IMAGING: No results found for this or any previous visit (from the past 48 hour(s)). No results found.  LIPID PANEL:    Component Value Date/Time   CHOL 260 (H) 11/25/2021 0809   TRIG 133 11/25/2021 0809   HDL 57 11/25/2021 0809   CHOLHDL 4.6 11/25/2021 0809   LDLCALC 176 (H) 11/25/2021 0809   LDLDIRECT 195  (H) 11/25/2021 0809    WEIGHTS: Wt Readings from Last 3 Encounters:  06/22/22 166 lb (75.3 kg)  01/05/22 164 lb 1.6 oz (74.4 kg)  05/26/21 165 lb (74.8 kg)    VITALS: BP 125/82   Pulse (!) 111   Ht 5\' 7"  (1.702 m)   Wt 166 lb (75.3 kg)   BMI 26.00 kg/m   EXAM: General appearance: alert and no distress Neck: no carotid bruit, no JVD, and thyroid not enlarged, symmetric, no tenderness/mass/nodules Lungs: clear to auscultation bilaterally Heart: regular rate and rhythm, S1, S2 normal, no murmur, click, rub or gallop Abdomen: soft, non-tender; bowel sounds normal; no masses,  no organomegaly Extremities: extremities normal, atraumatic, no cyanosis or edema Pulses: 2+ and symmetric Skin: Skin color, texture, turgor normal. No rashes or lesions Neurologic: Grossly normal Psych: Pleasant  *Examination chaperoned by , RN.  EKG: Deferred  ASSESSMENT: Mixed dyslipidemia with LDL greater than 190, possible familial hyperlipidemia Hypothyroidism  PLAN: 1.   Kayla Figueroa has a mixed dyslipidemia with LDL greater than 190, possibly familial hyperlipidemia as her father had high cholesterol however no early onset heart disease.  We discussed genetic testing today and she seemed agreeable to that.  She has had some improvement in her lipids with treatment of her hypothyroidism.  There also was significant weight loss previously which may have played a role in her cholesterol.  I think she is a good candidate for statin therapy would recommend atorvastatin 40 mg daily.  We will go ahead and check a lipid NMR and LP(a) in 3 to 4 months on therapy and plan follow-up at that time.  Thanks again for the kind referral.  Veleta Miners, MD, [facility]  [place]  [facility]  Medical Director of the Advanced Lipid Disorders &  Cardiovascular Risk Reduction Clinic Diplomate of the American Board of Clinical Lipidology Attending Cardiologist  Direct Dial: [phone]   Fax: [phone]  Website:  [email or web address]  [phone] Blayton Huttner 06/22/2022, 1:59 PM

## 2022-06-29 ENCOUNTER — Encounter: Payer: Self-pay | Admitting: Family Medicine

## 2022-06-29 DIAGNOSIS — E7849 Other hyperlipidemia: Secondary | ICD-10-CM

## 2022-06-29 DIAGNOSIS — R7309 Other abnormal glucose: Secondary | ICD-10-CM

## 2022-06-29 DIAGNOSIS — E039 Hypothyroidism, unspecified: Secondary | ICD-10-CM

## 2022-06-29 DIAGNOSIS — Z0000 Encounter for general adult medical examination without abnormal findings: Secondary | ICD-10-CM

## 2022-07-01 ENCOUNTER — Other Ambulatory Visit: Payer: Self-pay

## 2022-07-01 DIAGNOSIS — R7309 Other abnormal glucose: Secondary | ICD-10-CM

## 2022-07-01 DIAGNOSIS — Z0000 Encounter for general adult medical examination without abnormal findings: Secondary | ICD-10-CM

## 2022-07-01 DIAGNOSIS — E7849 Other hyperlipidemia: Secondary | ICD-10-CM

## 2022-07-01 DIAGNOSIS — E039 Hypothyroidism, unspecified: Secondary | ICD-10-CM

## 2022-07-02 ENCOUNTER — Other Ambulatory Visit: Payer: BC Managed Care – PPO

## 2022-07-02 DIAGNOSIS — E7849 Other hyperlipidemia: Secondary | ICD-10-CM | POA: Diagnosis not present

## 2022-07-02 DIAGNOSIS — R7309 Other abnormal glucose: Secondary | ICD-10-CM | POA: Diagnosis not present

## 2022-07-02 DIAGNOSIS — E039 Hypothyroidism, unspecified: Secondary | ICD-10-CM | POA: Diagnosis not present

## 2022-07-02 DIAGNOSIS — Z0000 Encounter for general adult medical examination without abnormal findings: Secondary | ICD-10-CM | POA: Diagnosis not present

## 2022-07-03 LAB — COMPLETE METABOLIC PANEL WITH GFR
AG Ratio: 1.8 (calc) (ref 1.0–2.5)
ALT: 12 U/L (ref 6–29)
AST: 12 U/L (ref 10–30)
Albumin: 4 g/dL (ref 3.6–5.1)
Alkaline phosphatase (APISO): 47 U/L (ref 31–125)
BUN/Creatinine Ratio: 12 (calc) (ref 6–22)
BUN: 12 mg/dL (ref 7–25)
CO2: 25 mmol/L (ref 20–32)
Calcium: 8.7 mg/dL (ref 8.6–10.2)
Chloride: 103 mmol/L (ref 98–110)
Creat: 1.04 mg/dL — ABNORMAL HIGH (ref 0.50–0.99)
Globulin: 2.2 g/dL (calc) (ref 1.9–3.7)
Glucose, Bld: 87 mg/dL (ref 65–99)
Potassium: 4.2 mmol/L (ref 3.5–5.3)
Sodium: 138 mmol/L (ref 135–146)
Total Bilirubin: 0.7 mg/dL (ref 0.2–1.2)
Total Protein: 6.2 g/dL (ref 6.1–8.1)
eGFR: 69 mL/min/{1.73_m2} (ref >=60)

## 2022-07-03 LAB — CBC WITH DIFFERENTIAL/PLATELET
Absolute Monocytes: 313 cells/uL (ref 200–950)
Basophils Absolute: 41 cells/uL (ref 0–200)
Basophils Relative: 0.7 %
Eosinophils Absolute: 71 cells/uL (ref 15–500)
Eosinophils Relative: 1.2 %
HCT: 44.2 % (ref 35.0–45.0)
Hemoglobin: 15 g/dL (ref 11.7–15.5)
Lymphs Abs: 1499 cells/uL (ref 850–3900)
MCH: 30.1 pg (ref 27.0–33.0)
MCHC: 33.9 g/dL (ref 32.0–36.0)
MCV: 88.8 fL (ref 80.0–100.0)
MPV: 12 fL (ref 7.5–12.5)
Monocytes Relative: 5.3 %
Neutro Abs: 3977 cells/uL (ref 1500–7800)
Neutrophils Relative %: 67.4 %
Platelets: 210 10*3/uL (ref 140–400)
RBC: 4.98 10*6/uL (ref 3.80–5.10)
RDW: 12 % (ref 11.0–15.0)
Total Lymphocyte: 25.4 %
WBC: 5.9 10*3/uL (ref 3.8–10.8)

## 2022-07-03 LAB — HEMOGLOBIN A1C
Hgb A1c MFr Bld: 5 % of total Hgb (ref <5.7)
Mean Plasma Glucose: 97 mg/dL
eAG (mmol/L): 5.4 mmol/L

## 2022-07-03 LAB — T4, FREE: Free T4: 1 ng/dL (ref 0.8–1.8)

## 2022-07-03 LAB — TSH: TSH: 3.19 mIU/L

## 2022-07-05 ENCOUNTER — Encounter (HOSPITAL_BASED_OUTPATIENT_CLINIC_OR_DEPARTMENT_OTHER): Payer: Self-pay | Admitting: Internal Medicine

## 2022-07-05 DIAGNOSIS — E7849 Other hyperlipidemia: Secondary | ICD-10-CM

## 2022-07-07 MED ORDER — EZETIMIBE 10 MG PO TABS
10.0000 mg | ORAL_TABLET | Freq: Every day | ORAL | 3 refills | Status: DC
Start: 2022-07-07 — End: 2023-01-04

## 2022-07-08 ENCOUNTER — Other Ambulatory Visit: Payer: Self-pay | Admitting: *Deleted

## 2022-07-08 DIAGNOSIS — E7849 Other hyperlipidemia: Secondary | ICD-10-CM

## 2022-07-08 NOTE — Telephone Encounter (Signed)
NMR lab order mailed 07/08/22 - to be done in Dec 2024

## 2022-07-09 ENCOUNTER — Ambulatory Visit (INDEPENDENT_AMBULATORY_CARE_PROVIDER_SITE_OTHER): Payer: BC Managed Care – PPO | Admitting: Family Medicine

## 2022-07-09 ENCOUNTER — Other Ambulatory Visit: Payer: Self-pay | Admitting: Family Medicine

## 2022-07-09 ENCOUNTER — Encounter: Payer: Self-pay | Admitting: Family Medicine

## 2022-07-09 VITALS — BP 119/72 | HR 96 | Ht 67.0 in | Wt 165.8 lb

## 2022-07-09 DIAGNOSIS — E7849 Other hyperlipidemia: Secondary | ICD-10-CM

## 2022-07-09 DIAGNOSIS — R7309 Other abnormal glucose: Secondary | ICD-10-CM

## 2022-07-09 DIAGNOSIS — E039 Hypothyroidism, unspecified: Secondary | ICD-10-CM

## 2022-07-09 DIAGNOSIS — Z0000 Encounter for general adult medical examination without abnormal findings: Secondary | ICD-10-CM

## 2022-07-09 DIAGNOSIS — F411 Generalized anxiety disorder: Secondary | ICD-10-CM

## 2022-07-09 DIAGNOSIS — Z6825 Body mass index (BMI) 25.0-25.9, adult: Secondary | ICD-10-CM | POA: Diagnosis not present

## 2022-07-09 NOTE — Progress Notes (Signed)
Subjective:    Patient ID: Kayla Figueroa, female    DOB: 08-Jan-1981, 41 y.o.   MRN: [id number]  Kayla Figueroa is a 41 y.o. female presenting on 07/09/2022 for Annual Exam   HPI  Here for Annual Physical and Lab Review   Familial HYPERLIPIDEMIA: Known fam history of hyperlipidemia Dr Rennis Golden last seen 06/22/22 overall significantly improved on therapy, recently she was started on Zetia as supplemental medication to help lower LDL goal <100. She has had adv lipid profile recently and will repeat this through Dr Rennis Golden   Hypothyroidism Last lab normal T4 and TSH 06/2022 Continues on Levothyroxine daily   Generalized Anxiety Disorder (GAD) [facility] Dr Janeece Riggers in [facility] on medications Wellbutrin SR 100mg  daily and Duloxetine 30mg  daily No new concerns.     Health Maintenance:   COVID19 vaccine, completed initial series and booster Pfizer 08/22/20.   Colon CA Screening: Never had colonoscopy. Currently asymptomatic except some constipation functional. Known family history of colon POLYPS (multiple - from father age 91s unsure which age precisely at this time) and no fam history of colon cancer. Due for screening test age 35+ unless father had high number polyps age 57 or earlier.     07/09/2022   10:27 AM 05/26/2021    1:58 PM 05/26/2021    1:45 PM  Depression screen PHQ 2/9  Decreased Interest 0 0 0  Down, Depressed, Hopeless 0 0 0  PHQ - 2 Score 0 0 0  Altered sleeping 1 3 3   Tired, decreased energy 2 1 1   Change in appetite 0 0 0  Feeling bad or failure about yourself  0 0 0  Trouble concentrating 0 0 0  Moving slowly or fidgety/restless 0 0 0  Suicidal thoughts 0 0 0  PHQ-9 Score 3 4 4   Difficult doing work/chores Not difficult at all Not difficult at all Not difficult at all    Past Medical History:  Diagnosis Date   Allergy    Anxiety    Chronic fatigue    Depression    GERD (gastroesophageal reflux disease)    RARE   Heart  murmur    present in high school--no longer present   Past Surgical History:  Procedure Laterality Date   HYSTEROSCOPY WITH D & C N/A 04/24/2018   Procedure: DILATATION AND CURETTAGE /HYSTEROSCOPY;  Surgeon: Ward, 4/9, MD;  Location: [facility] ORS;  Service: Gynecology;  Laterality: N/A;   TONSILLECTOMY AND ADENOIDECTOMY  2005   as an adult   Social History   Socioeconomic History   Marital status: Married    Spouse name: Not on file   Number of children: Not on file   Years of education: Not on file   Highest education level: Not on file  Occupational History   Not on file  Tobacco Use   Smoking status: Never   Smokeless tobacco: Never  Vaping Use   Vaping Use: Never used  Substance and Sexual Activity   Alcohol use: No   Drug use: No   Sexual activity: Yes    Partners: Male    Birth control/protection: Pill    Comment: Junel  Other Topics Concern   Not on file  Social History Narrative   Not on file   Social Determinants of Health   Financial Resource Strain: Not on file  Food Insecurity: Not on file  Transportation Needs: Not on file  Physical Activity: Not on file  Stress: Not on file  Social  Connections: Not on file  Intimate Partner Violence: Not on file   Family History  Problem Relation Age of Onset   Hyperlipidemia Father    Colon polyps Father    Diverticulosis Father        has had bowel resection for diverticulitis   Osteoporosis Mother    Hypothyroidism Mother    Osteoporosis Maternal Grandmother    Current Outpatient Medications on File Prior to Visit  Medication Sig   Bacillus Coagulans-Inulin (PROBIOTIC) 1-250 BILLION-MG CAPS    buPROPion (WELLBUTRIN SR) 100 MG 12 hr tablet Take 1 tablet (100 mg total) by mouth every morning.   calcium carbonate (OS-CAL) 600 MG TABS Take 600 mg by mouth every evening.    cetirizine (ZYRTEC) 10 MG tablet Take 1 tablet (10 mg total) by mouth daily. (Patient taking differently: Take 10 mg by mouth every  morning.)   DULoxetine (CYMBALTA) 30 MG capsule Take 30 mg by mouth every morning.    ezetimibe (ZETIA) 10 MG tablet Take 1 tablet (10 mg total) by mouth daily.   KELNOR 1/50 1-50 MG-MCG tablet Take 1 tablet by mouth daily.   levothyroxine (SYNTHROID) 50 MCG tablet TAKE 1 TABLET BY MOUTH DAILY BEFORE BREAKFAST   Melatonin 3 MG TABS Take 3 mg by mouth at bedtime as needed (for sleep.).    Multiple Vitamins-Minerals (MULTIVITAMIN ADULTS PO) Take by mouth.   Omega-3 Fatty Acids (FISH OIL) 1000 MG CAPS    triamcinolone ointment (KENALOG) 0.5 %    atorvastatin (LIPITOR) 40 MG tablet Take 1 tablet (40 mg total) by mouth daily.   No current facility-administered medications on file prior to visit.    Review of Systems  Constitutional:  Negative for activity change, appetite change, chills, diaphoresis, fatigue and fever.  HENT:  Negative for congestion and hearing loss.   Eyes:  Negative for visual disturbance.  Respiratory:  Negative for cough, chest tightness, shortness of breath and wheezing.   Cardiovascular:  Negative for chest pain, palpitations and leg swelling.  Gastrointestinal:  Negative for abdominal pain, constipation, diarrhea, nausea and vomiting.  Genitourinary:  Negative for dysuria, frequency and hematuria.  Musculoskeletal:  Negative for arthralgias and neck pain.  Skin:  Negative for rash.  Neurological:  Negative for dizziness, weakness, light-headedness, numbness and headaches.  Hematological:  Negative for adenopathy.  Psychiatric/Behavioral:  Negative for behavioral problems, dysphoric mood and sleep disturbance.    Per HPI unless specifically indicated above      Objective:    BP 119/72   Pulse 96   Ht 5\' 7"  (1.702 m)   Wt 165 lb 12.8 oz (75.2 kg)   SpO2 100%   BMI 25.97 kg/m   Wt Readings from Last 3 Encounters:  07/09/22 165 lb 12.8 oz (75.2 kg)  06/22/22 166 lb (75.3 kg)  01/05/22 164 lb 1.6 oz (74.4 kg)    Physical Exam Vitals and nursing note  reviewed.  Constitutional:      General: She is not in acute distress.    Appearance: She is well-developed. She is not diaphoretic.     Comments: Well-appearing, comfortable, cooperative  HENT:     Head: Normocephalic and atraumatic.  Eyes:     General:        Right eye: No discharge.        Left eye: No discharge.     Conjunctiva/sclera: Conjunctivae normal.     Pupils: Pupils are equal, round, and reactive to light.  Neck:     Thyroid: No  thyromegaly.     Vascular: No carotid bruit.  Cardiovascular:     Rate and Rhythm: Normal rate and regular rhythm.     Pulses: Normal pulses.     Heart sounds: Normal heart sounds. No murmur heard. Pulmonary:     Effort: Pulmonary effort is normal. No respiratory distress.     Breath sounds: Normal breath sounds. No wheezing or rales.  Abdominal:     General: Bowel sounds are normal. There is no distension.     Palpations: Abdomen is soft. There is no mass.     Tenderness: There is no abdominal tenderness.  Musculoskeletal:        General: No tenderness. Normal range of motion.     Cervical back: Normal range of motion and neck supple.     Right lower leg: No edema.     Left lower leg: No edema.     Comments: Upper / Lower Extremities: - Normal muscle tone, strength bilateral upper extremities 5/5, lower extremities 5/5  Lymphadenopathy:     Cervical: No cervical adenopathy.  Skin:    General: Skin is warm and dry.     Findings: No erythema or rash.  Neurological:     Mental Status: She is alert and oriented to person, place, and time.     Comments: Distal sensation intact to light touch all extremities  Psychiatric:        Mood and Affect: Mood normal.        Behavior: Behavior normal.        Thought Content: Thought content normal.     Comments: Well groomed, good eye contact, normal speech and thoughts      I have personally reviewed the radiology report from 09/04/21 Mammogram [facility].   Mammo screening breast tomosynthesis  bilateral  Anatomical Region Laterality Modality  Breast Bilateral bilateral Mammography  Breast Left -- --  Breast Right -- --   Impression  No mammographic evidence of malignancy. Recommend routine mammography  screening in one year.     BI-RADS: 2 - Benign Narrative  EXAM:  MAMMO SCREEN BREAST WITH TOMOSYNTHESIS BILATERAL 09/04/2021  2:30 PM   INDICATION:  Screening   COMPARISON:  None.   TECHNIQUE:  Tomosynthesis images were obtained as part of this exam.   FINDINGS:  The breasts have scattered areas of fibroglandular density.    Benign-appearing bilateral intramammary lymph nodes noted.   There are no suspicious masses, calcifications, or other findings in  either breast. Exam End: 09/04/21 14:45   Specimen Collected: 09/04/21 14:51 Last Resulted: 09/07/21 10:21  Received From: [facility]  Result Received: 11/13/21 10:45    Results for orders placed or performed in visit on 07/09/22  HM MAMMOGRAPHY  Result Value Ref Range   HM Mammogram 0-4 Bi-Rad 0-4 Bi-Rad, Self Reported Normal      Assessment & Plan:   Problem List Items Addressed This Visit     Acquired hypothyroidism   Familial hyperlipidemia   GAD (generalized anxiety disorder)   Other Visit Diagnoses     Annual physical exam    -  Primary   BMI 25.0-25.9,adult           Updated Health Maintenance information Reviewed recent lab results with patient Encouraged improvement to lifestyle with diet and exercise Goal of weight loss  Future flu Shot when ready  Yearly mammogram 08/2022 through [facility]  No changes to medications today  Thyroid looks normal on labs. Keep on daily  Updated  the anxiety diagnosis and med rec. Follow w Dr Janeece Riggers (CBC)  Keep w Dr Blanchie Dessert plan to add the Zetia and follow up with their office on repeat lipid panel.  No orders of the defined types were placed in this encounter.     Follow up plan: Return in about 1 year (around  07/10/2023) for 1 year fasting lab only then 1 week later Annual Physical .  Future orders for 06/2023. No lipid panel. It will be handled by Lipid Clinic  Saralyn Pilar, DO [facility] 07/09/2022, 10:37 AM

## 2022-07-09 NOTE — Patient Instructions (Addendum)
Thank you for coming to the office today.  Future flu Shot when ready  Yearly mammogram 08/2022 through [facility]  Keep up the great work!  No changes to medications on our end today  Thyroid looks normal on labs. Keep on daily  Updated the anxiety diagnosis and med rec.  Keep w Dr Blanchie Dessert plan to add the Zetia and follow up with their office on repeat lipid panel.  DUE for FASTING BLOOD WORK (no food or drink after midnight before the lab appointment, only water or coffee without cream/sugar on the morning of)  SCHEDULE "Lab Only" visit in the morning at the clinic for lab draw in  1 YEAR  - Make sure Lab Only appointment is at about 1 week before your next appointment, so that results will be available  For Lab Results, once available within 2-3 days of blood draw, you can can log in to MyChart online to view your results and a brief explanation. Also, we can discuss results at next follow-up visit.   Please schedule a Follow-up Appointment to: Return in about 1 year (around 07/10/2023) for 1 year fasting lab only then 1 week later Annual Physical .  If you have any other questions or concerns, please feel free to call the office or send a message through MyChart. You may also schedule an earlier appointment if necessary.  Additionally, you may be receiving a survey about your experience at our office within a few days to 1 week by e-mail or mail. We value your feedback.  Saralyn Pilar, DO [facility], [place]

## 2022-08-04 ENCOUNTER — Encounter (HOSPITAL_BASED_OUTPATIENT_CLINIC_OR_DEPARTMENT_OTHER): Payer: Self-pay | Admitting: Internal Medicine

## 2022-09-03 DIAGNOSIS — N898 Other specified noninflammatory disorders of vagina: Secondary | ICD-10-CM | POA: Diagnosis not present

## 2022-09-03 DIAGNOSIS — N921 Excessive and frequent menstruation with irregular cycle: Secondary | ICD-10-CM | POA: Diagnosis not present

## 2022-09-10 DIAGNOSIS — Z1231 Encounter for screening mammogram for malignant neoplasm of breast: Secondary | ICD-10-CM | POA: Diagnosis not present

## 2022-09-22 DIAGNOSIS — E7849 Other hyperlipidemia: Secondary | ICD-10-CM | POA: Diagnosis not present

## 2022-09-23 LAB — NMR, LIPOPROFILE
Cholesterol, Total: 161 mg/dL (ref 100–199)
HDL Particle Number: 41.8 umol/L (ref >=30.5)
HDL-C: 69 mg/dL (ref >39)
LDL Particle Number: 792 nmol/L (ref <1000)
LDL Size: 21 nm (ref >20.5)
LDL-C (NIH Calc): 79 mg/dL (ref 0–99)
LP-IR Score: 47 — ABNORMAL HIGH (ref <=45)
Small LDL Particle Number: 343 nmol/L (ref <=527)
Triglycerides: 66 mg/dL (ref 0–149)

## 2022-09-29 ENCOUNTER — Ambulatory Visit (HOSPITAL_BASED_OUTPATIENT_CLINIC_OR_DEPARTMENT_OTHER): Payer: BC Managed Care – PPO | Admitting: Internal Medicine

## 2022-09-29 ENCOUNTER — Encounter (HOSPITAL_BASED_OUTPATIENT_CLINIC_OR_DEPARTMENT_OTHER): Payer: Self-pay | Admitting: Internal Medicine

## 2022-09-29 VITALS — BP 128/84 | HR 95 | Ht 67.0 in | Wt 169.0 lb

## 2022-09-29 DIAGNOSIS — E038 Other specified hypothyroidism: Secondary | ICD-10-CM

## 2022-09-29 DIAGNOSIS — E7849 Other hyperlipidemia: Secondary | ICD-10-CM | POA: Diagnosis not present

## 2022-09-29 NOTE — Progress Notes (Signed)
LIPID CLINIC CONSULT NOTE  Chief Complaint:  Follow-up familial hyperlipidemia  Primary Care Physician: [name], DO  Primary Cardiologist:  None  HPI:  Kayla Figueroa is a 41 y.o. female who is being seen today for the evaluation of familial hyperlipidemia at the request of Saralyn Pilar *.  This is a pleasant 41 year old female kindly referred for evaluation management of familial hyperlipidemia.  She has recently had very high cholesterol despite a near 50 pound weight loss and changes in her diet and activity.  Lipid testing about 8 months ago showed total cholesterol 313, HDL 70, triglycerides 98 and LDL 220.  She was also found to be hypothyroid and started on treatment.  Recent testing more than 6 months later has shown improvement in her lipids with total cholesterol 260, HDL 57, triglycerides 133 and LDL 176.  She does report high cholesterol in her father but no noted early onset heart disease.  She has no history of early onset heart disease.  She has no traditional cardiac risk factors such as hypertension, diabetes, smoking, etc.  She works for [employer] in [place] but had previously achieved a PhD in microbiology at [employer].  06/23/2022  [name] returns today for follow-up of familial hyperlipidemia.  She underwent blood work which fortunately showed a negative LP(a).  Her lipids have come down significantly on therapy.  Her LDL particle number is now 1004 and 39, LDL-C100, HDL 57 and triglycerides 101.  She reports tolerating atorvastatin 40 mg daily without any issues.  She has noted some recent fatigue and feels that she might be undertreated with regards to her thyroid.  This could be playing a role in her cholesterol.  Would like to speak with her primary care provider first to have that retested before considering potentially other therapies.  We did do genetic testing.  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  09/29/2022  [name] is seen today in follow-up.  She is doing extremely well on combination therapy with atorvastatin and ezetimibe.  Her LDL particle numbers have come down even further with and LDL-P of 792, total cholesterol 79, HDL 69, triglycerides 66 and small LDL particle number of 343.  All of her numbers are now within normal limits.  She is tolerating the combination therapy well.   PMHx:  Past Medical History:  Diagnosis Date   Allergy    Anxiety    Chronic fatigue    Depression    GERD (gastroesophageal reflux disease)    RARE   Heart murmur    present in high school--no longer present    Past Surgical History:  Procedure Laterality Date   HYSTEROSCOPY WITH D & C N/A 04/24/2018   Procedure: DILATATION AND CURETTAGE /HYSTEROSCOPY;  Surgeon: Ward, Elenora Fender, MD;  Location: [facility] ORS;  Service: Gynecology;  Laterality: N/A;   TONSILLECTOMY AND ADENOIDECTOMY  2005   as an adult    FAMHx:  Family History  Problem Relation Age of Onset   Hyperlipidemia Father    Colon polyps Father    Diverticulosis Father        has had bowel resection for diverticulitis   Osteoporosis Mother    Hypothyroidism Mother    Osteoporosis Maternal Grandmother     SOCHx:   reports that she has never smoked. She has never used smokeless tobacco. She reports that she does not drink alcohol and does not use drugs.  ALLERGIES:  Allergies  Allergen Reactions   Mangifera Indica Fruit Ext (Mango) [Mangifera Indica]    Sulfa Antibiotics Hives    ROS: Pertinent items noted in HPI and remainder of comprehensive ROS otherwise negative.  HOME MEDS: Current Outpatient Medications on File Prior to Visit  Medication Sig Dispense Refill   Bacillus Coagulans-Inulin (PROBIOTIC) 1-250 BILLION-MG CAPS      buPROPion (WELLBUTRIN SR) 100 MG 12 hr tablet Take 1 tablet (100 mg total) by mouth every morning.     calcium carbonate (OS-CAL) 600 MG TABS Take 600 mg by  mouth every evening.      cetirizine (ZYRTEC) 10 MG tablet Take 1 tablet (10 mg total) by mouth daily. (Patient taking differently: Take 10 mg by mouth every morning.) 30 tablet 11   DULoxetine (CYMBALTA) 30 MG capsule Take 30 mg by mouth every morning.   0   ezetimibe (ZETIA) 10 MG tablet Take 1 tablet (10 mg total) by mouth daily. 90 tablet 3   levothyroxine (SYNTHROID) 50 MCG tablet TAKE 1 TABLET BY MOUTH DAILY BEFORE BREAKFAST 90 tablet 2   Melatonin 3 MG TABS Take 3 mg by mouth at bedtime as needed (for sleep.).      Multiple Vitamins-Minerals (MULTIVITAMIN ADULTS PO) Take by mouth.     Omega-3 Fatty Acids (FISH OIL) 1000 MG CAPS      triamcinolone ointment (KENALOG) 0.5 %      atorvastatin (LIPITOR) 40 MG tablet Take 1 tablet (40 mg total) by mouth daily. 90 tablet 3   No current facility-administered medications on file prior to visit.    LABS/IMAGING: No results found for this or any previous visit (from the past 48 hour(s)). No results found.  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   Ms. Crotwell has had significant improvement in her lipids which are down within normal ranges on combination therapy of atorvastatin and ezetimibe.  Will continue this current regimen.  She reports her thyroid is well-regulated.  She had a negative LP(a).  Plan follow-up with me annually with a  repeat lipid profile or sooner as necessary.  Pixie Casino, MD, [facility], [place] Director of the Advanced Lipid Disorders &  Cardiovascular Risk Reduction Clinic Diplomate of the American Board of Clinical Lipidology Attending Cardiologist  Direct Dial: [phone]  Fax: [phone]  Website:  [email or web address] 09/29/2022, 1:17 PM

## 2022-09-29 NOTE — Patient Instructions (Signed)
Medication Instructions:  NO CHANGES  *If you need a refill on your cardiac medications before your next appointment, please call your pharmacy*   Lab Work: FASTING lab work to check cholesterol in 1 year -- complete before next visit   If you have labs (blood work) drawn today and your tests are completely normal, you will receive your results only by: MyChart Message (if you have MyChart) OR A paper copy in the mail If you have any lab test that is abnormal or we need to change your treatment, we will call you to review the results.   Testing/Procedures: NONE   Follow-Up: At [facility], you and your health needs are our priority.  As part of our continuing mission to provide you with exceptional heart care, we have created designated Provider Care Teams.  These Care Teams include your primary Cardiologist (physician) and Advanced Practice Providers (APPs -  Physician Assistants and Nurse Practitioners) who all work together to provide you with the care you need, when you need it.  We recommend signing up for the patient portal called "MyChart".  Sign up information is provided on this After Visit Summary.  MyChart is used to connect with patients for Virtual Visits (Telemedicine).  Patients are able to view lab/test results, encounter notes, upcoming appointments, etc.  Non-urgent messages can be sent to your provider as well.   To learn more about what you can do with MyChart, go to [email or web address].    Your next appointment:   12 month(s)  The format for your next appointment:   In Person  Provider:   Zoila Shutter MD

## 2022-10-01 DIAGNOSIS — N921 Excessive and frequent menstruation with irregular cycle: Secondary | ICD-10-CM | POA: Diagnosis not present

## 2022-10-07 ENCOUNTER — Encounter (HOSPITAL_BASED_OUTPATIENT_CLINIC_OR_DEPARTMENT_OTHER): Payer: Self-pay | Admitting: Internal Medicine

## 2022-10-07 DIAGNOSIS — E7800 Pure hypercholesterolemia, unspecified: Secondary | ICD-10-CM

## 2022-10-07 MED ORDER — ATORVASTATIN CALCIUM 40 MG PO TABS: 40.0000 mg | ORAL_TABLET | Freq: Every day | ORAL | 3 refills | Status: DC

## 2022-10-11 DIAGNOSIS — N921 Excessive and frequent menstruation with irregular cycle: Secondary | ICD-10-CM | POA: Diagnosis not present

## 2022-10-11 DIAGNOSIS — Z79890 Hormone replacement therapy (postmenopausal): Secondary | ICD-10-CM | POA: Diagnosis not present

## 2022-10-11 DIAGNOSIS — F529 Unspecified sexual dysfunction not due to a substance or known physiological condition: Secondary | ICD-10-CM | POA: Diagnosis not present

## 2022-10-11 DIAGNOSIS — Z780 Asymptomatic menopausal state: Secondary | ICD-10-CM | POA: Diagnosis not present

## 2022-10-23 ENCOUNTER — Other Ambulatory Visit: Payer: Self-pay | Admitting: Family Medicine

## 2022-10-23 DIAGNOSIS — E039 Hypothyroidism, unspecified: Secondary | ICD-10-CM

## 2022-10-24 NOTE — Telephone Encounter (Signed)
Requested Prescriptions  Pending Prescriptions Disp Refills   levothyroxine (SYNTHROID) 50 MCG tablet [Pharmacy Med Name: Levothyroxine Sodium Tablet] 90 tablet 2    Sig: Take 1 tablet by mouth daily before breakfast.     Endocrinology:  Hypothyroid Agents Passed - 10/23/2022  1:49 PM      Passed - TSH in normal range and within 360 days    TSH  Date Value Ref Range Status  07/02/2022 3.19 mIU/L Final    Comment:              Reference Range .           > or = 20 Years  0.40-4.50 .                Pregnancy Ranges           First trimester    0.26-2.66           Second trimester   0.55-2.73           Third trimester    0.43-2.91          Passed - Valid encounter within last 12 months    Recent Outpatient Visits           3 months ago Annual physical exam   [facility] [name], DO   1 year ago Annual physical exam   [facility] [name], DO   1 year ago Acquired hypothyroidism   [facility] [name], [name], DO   2 years ago Menopausal vasomotor syndrome   [facility] [name], DO   2 years ago Menopausal vasomotor syndrome   [facility] [name], [name], [place]

## 2022-11-23 DIAGNOSIS — F339 Major depressive disorder, recurrent, unspecified: Secondary | ICD-10-CM | POA: Diagnosis not present

## 2022-11-23 DIAGNOSIS — F411 Generalized anxiety disorder: Secondary | ICD-10-CM | POA: Diagnosis not present

## 2023-01-03 ENCOUNTER — Encounter (HOSPITAL_BASED_OUTPATIENT_CLINIC_OR_DEPARTMENT_OTHER): Payer: Self-pay | Admitting: Internal Medicine

## 2023-01-04 MED ORDER — EZETIMIBE 10 MG PO TABS: 10.0000 mg | ORAL_TABLET | Freq: Every day | ORAL | 3 refills | Status: DC

## 2023-01-07 DIAGNOSIS — F5222 Female sexual arousal disorder: Secondary | ICD-10-CM | POA: Diagnosis not present

## 2023-01-12 DIAGNOSIS — Z780 Asymptomatic menopausal state: Secondary | ICD-10-CM | POA: Diagnosis not present

## 2023-01-12 DIAGNOSIS — F529 Unspecified sexual dysfunction not due to a substance or known physiological condition: Secondary | ICD-10-CM | POA: Diagnosis not present

## 2023-04-06 DIAGNOSIS — Z1231 Encounter for screening mammogram for malignant neoplasm of breast: Secondary | ICD-10-CM | POA: Diagnosis not present

## 2023-04-06 DIAGNOSIS — Z01419 Encounter for gynecological examination (general) (routine) without abnormal findings: Secondary | ICD-10-CM | POA: Diagnosis not present

## 2023-04-06 DIAGNOSIS — E2839 Other primary ovarian failure: Secondary | ICD-10-CM | POA: Diagnosis not present

## 2023-07-11 ENCOUNTER — Telehealth (HOSPITAL_BASED_OUTPATIENT_CLINIC_OR_DEPARTMENT_OTHER): Payer: Self-pay | Admitting: Internal Medicine

## 2023-07-11 DIAGNOSIS — E7849 Other hyperlipidemia: Secondary | ICD-10-CM

## 2023-07-11 NOTE — Telephone Encounter (Signed)
Patient called in setting up her 1 year lipid f/u with Dr. Rennis Golden.   The appointment was scheduled for virtual on 01/10.   Patient is requesting lab orders be released or mailed to her home to have performed at a [facility] prior to her appointment.   Please advise.

## 2023-07-11 NOTE — Telephone Encounter (Signed)
Updated order and will mail lab orders, advised patient

## 2023-07-27 ENCOUNTER — Other Ambulatory Visit: Payer: BC Managed Care – PPO

## 2023-07-27 DIAGNOSIS — F411 Generalized anxiety disorder: Secondary | ICD-10-CM

## 2023-07-27 DIAGNOSIS — E7849 Other hyperlipidemia: Secondary | ICD-10-CM

## 2023-07-27 DIAGNOSIS — E039 Hypothyroidism, unspecified: Secondary | ICD-10-CM | POA: Diagnosis not present

## 2023-07-27 DIAGNOSIS — Z0000 Encounter for general adult medical examination without abnormal findings: Secondary | ICD-10-CM | POA: Diagnosis not present

## 2023-07-27 DIAGNOSIS — R7309 Other abnormal glucose: Secondary | ICD-10-CM | POA: Diagnosis not present

## 2023-07-28 LAB — CBC WITH DIFFERENTIAL/PLATELET
Absolute Monocytes: 395 {cells}/uL (ref 200–950)
Basophils Absolute: 42 {cells}/uL (ref 0–200)
Basophils Relative: 0.9 %
Eosinophils Absolute: 80 {cells}/uL (ref 15–500)
Eosinophils Relative: 1.7 %
HCT: 47.4 % — ABNORMAL HIGH (ref 35.0–45.0)
Hemoglobin: 15.6 g/dL — ABNORMAL HIGH (ref 11.7–15.5)
Lymphs Abs: 1810 {cells}/uL (ref 850–3900)
MCH: 28.8 pg (ref 27.0–33.0)
MCHC: 32.9 g/dL (ref 32.0–36.0)
MCV: 87.6 fL (ref 80.0–100.0)
MPV: 12.1 fL (ref 7.5–12.5)
Monocytes Relative: 8.4 %
Neutro Abs: 2374 {cells}/uL (ref 1500–7800)
Neutrophils Relative %: 50.5 %
Platelets: 192 10*3/uL (ref 140–400)
RBC: 5.41 10*6/uL — ABNORMAL HIGH (ref 3.80–5.10)
RDW: 12 % (ref 11.0–15.0)
Total Lymphocyte: 38.5 %
WBC: 4.7 10*3/uL (ref 3.8–10.8)

## 2023-07-28 LAB — COMPREHENSIVE METABOLIC PANEL
AG Ratio: 2.3 (calc) (ref 1.0–2.5)
ALT: 12 U/L (ref 6–29)
AST: 16 U/L (ref 10–30)
Albumin: 4.6 g/dL (ref 3.6–5.1)
Alkaline phosphatase (APISO): 69 U/L (ref 31–125)
BUN/Creatinine Ratio: 15 (calc) (ref 6–22)
BUN: 17 mg/dL (ref 7–25)
CO2: 28 mmol/L (ref 20–32)
Calcium: 9.9 mg/dL (ref 8.6–10.2)
Chloride: 101 mmol/L (ref 98–110)
Creat: 1.11 mg/dL — ABNORMAL HIGH (ref 0.50–0.99)
Globulin: 2 g/dL (ref 1.9–3.7)
Glucose, Bld: 86 mg/dL (ref 65–99)
Potassium: 4.3 mmol/L (ref 3.5–5.3)
Sodium: 138 mmol/L (ref 135–146)
Total Bilirubin: 1.2 mg/dL (ref 0.2–1.2)
Total Protein: 6.6 g/dL (ref 6.1–8.1)

## 2023-07-28 LAB — LIPID PANEL
Cholesterol: 143 mg/dL (ref <200)
HDL: 70 mg/dL (ref >=50)
LDL Cholesterol (Calc): 59 mg/dL
Non-HDL Cholesterol (Calc): 73 mg/dL (ref <130)
Total CHOL/HDL Ratio: 2 (calc) (ref <5.0)
Triglycerides: 63 mg/dL (ref <150)

## 2023-07-28 LAB — HEMOGLOBIN A1C
Hgb A1c MFr Bld: 5.2 %{Hb} (ref <5.7)
Mean Plasma Glucose: 103 mg/dL
eAG (mmol/L): 5.7 mmol/L

## 2023-07-28 LAB — TSH: TSH: 1.96 m[IU]/L

## 2023-08-02 ENCOUNTER — Other Ambulatory Visit: Payer: Self-pay | Admitting: Family Medicine

## 2023-08-02 DIAGNOSIS — E039 Hypothyroidism, unspecified: Secondary | ICD-10-CM

## 2023-08-03 ENCOUNTER — Encounter: Payer: Self-pay | Admitting: Family Medicine

## 2023-08-03 ENCOUNTER — Ambulatory Visit (INDEPENDENT_AMBULATORY_CARE_PROVIDER_SITE_OTHER): Payer: BC Managed Care – PPO | Admitting: Family Medicine

## 2023-08-03 VITALS — BP 112/76 | HR 102 | Ht 65.75 in | Wt 163.0 lb

## 2023-08-03 DIAGNOSIS — Z0000 Encounter for general adult medical examination without abnormal findings: Secondary | ICD-10-CM | POA: Diagnosis not present

## 2023-08-03 NOTE — Telephone Encounter (Signed)
Requested medication (s) are due for refill today: Yes  Requested medication (s) are on the active medication list: Yes  Last refill:  10/24/22 #90, 2RF  Future visit scheduled: Yes  Notes to clinic:  .rx     Requested Prescriptions  Pending Prescriptions Disp Refills   levothyroxine (SYNTHROID) 50 MCG tablet [Pharmacy Med Name: Levothyroxine Sodium Tablet] 90 tablet 1    Sig: Take 1 tablet by mouth daily before breakfast.     Endocrinology:  Hypothyroid Agents Failed - 08/02/2023  4:42 PM      Failed - Valid encounter within last 12 months    Recent Outpatient Visits           1 year ago Annual physical exam   [place] [facility] Althea Charon, [name], DO   2 years ago Annual physical exam   [place] [facility] [name], DO   2 years ago Acquired hypothyroidism   [place] [facility] [name], DO   2 years ago Menopausal vasomotor syndrome   [place] [facility] [name], DO   3 years ago Menopausal vasomotor syndrome   [place] [facility] [place], [name], DO       Future Appointments             Today Althea Charon [name], DO  [facility]   In 3 months Lovell, Lisette Abu, MD [facility]            Passed - TSH in normal range and within 360 days    TSH  Date Value Ref Range Status  07/27/2023 1.96 mIU/L Final    Comment:              Reference Range .           > or = 20 Years  0.40-4.50 .                Pregnancy Ranges           First trimester    0.26-2.66           Second trimester   0.55-2.73           Third trimester    0.43-2.91

## 2023-08-03 NOTE — Patient Instructions (Addendum)
Thank you for coming to the office today.  Keep up  he good work overall  For the Fatigue  Repeat thyroid panel including Free T4 Add vitamin testing for deficiency - Vitamin B12, and Vitamin D  If still cannot find the answer, we can try dose increase to Levothyroxine  Alternatively possibility of side effect on Statin.   DUE for FASTING BLOOD WORK (no food or drink after midnight before the lab appointment, only water or coffee without cream/sugar on the morning of)  SCHEDULE "Lab Only" visit in the morning at the clinic for lab draw in Fri 10/11 915am  - Make sure Lab Only appointment is at about 1 week before your next appointment, so that results will be available  For Lab Results, once available within 2-3 days of blood draw, you can can log in to MyChart online to view your results and a brief explanation. Also, we can discuss results at next follow-up visit.     Please schedule a Follow-up Appointment to: Return in about 1 year (around 08/02/2024) for 1 year Annual Physical.  If you have any other questions or concerns, please feel free to call the office or send a message through MyChart. You may also schedule an earlier appointment if necessary.  Additionally, you may be receiving a survey about your experience at our office within a few days to 1 week by e-mail or mail. We value your feedback.  Saralyn Pilar, DO [facility], [place]

## 2023-08-03 NOTE — Progress Notes (Unsigned)
Subjective:    Patient ID: Kayla Figueroa, female    DOB: February 19, 1981, 42 y.o.   MRN: [id number]  Kayla Figueroa is a 42 y.o. female presenting on 08/03/2023 for Annual Exam   HPI  Here for Annual Physical and Lab Review  Familial HYPERLIPIDEMIA: Known fam history of hyperlipidemia Dr Rennis Golden last seen 06/22/22 overall significantly improved on therapy, recently she was started on Zetia as supplemental medication to help lower LDL goal <100. She has had adv lipid profile recently and will repeat this through Dr Rennis Golden   Hypothyroidism Last lab normal T4 and TSH 06/2022 Continues on Levothyroxine daily   ***She admits variety of symptoms related to thyroid that can contribute to her symptoms often.  ***Fatigue all the time Other labs, sleep?? Post COVID?? Sensitive to cold *** Nails brittle  ***Sleep *** 6-7 hours, 1 hour nap History of chronic fatigue *** 2013  ***Labs on Friday Vit D B12 and Free T4 Anemia panel  ***If not able to solve the answer we can adjust Levothyroxine inc dose.***  ***    Generalized Anxiety Disorder (GAD) [facility] Dr Janeece Riggers in [facility] on medications Wellbutrin SR 100mg  daily and Duloxetine 30mg  daily No new concerns.     Health Maintenance:   COVID19 vaccine, completed initial series and booster Pfizer 08/22/20.   Colon CA Screening: Never had colonoscopy. Currently asymptomatic except some constipation functional. Known family history of colon POLYPS (multiple - from father age 85s unsure which age precisely at this time) and no fam history of colon cancer. Due for screening test age 51+ unless father had high number polyps age 22 or earlier.  ***[facility]  Not due for pap year, 5 yr since 2021.     07/09/2022   10:27 AM 05/26/2021    1:58 PM 05/26/2021    1:45 PM  Depression screen PHQ 2/9  Decreased Interest 0 0 0  Down, Depressed, Hopeless 0 0 0  PHQ - 2 Score 0 0 0  Altered sleeping 1 3 3    Tired, decreased energy 2 1 1   Change in appetite 0 0 0  Feeling bad or failure about yourself  0 0 0  Trouble concentrating 0 0 0  Moving slowly or fidgety/restless 0 0 0  Suicidal thoughts 0 0 0  PHQ-9 Score 3 4 4   Difficult doing work/chores Not difficult at all Not difficult at all Not difficult at all    Past Medical History:  Diagnosis Date   Allergy    Anxiety    Chronic fatigue    Depression    GERD (gastroesophageal reflux disease)    RARE   Heart murmur    present in high school--no longer present   Hyperlipidemia 2022   Thyroid disease 2022   Past Surgical History:  Procedure Laterality Date   HYSTEROSCOPY WITH D & C N/A 04/24/2018   Procedure: DILATATION AND CURETTAGE /HYSTEROSCOPY;  Surgeon: Ward, Elenora Fender, MD;  Location: [facility] ORS;  Service: Gynecology;  Laterality: N/A;   TONSILLECTOMY AND ADENOIDECTOMY  10/26/2003   as an adult   Social History   Socioeconomic History   Marital status: Married    Spouse name: Not on file   Number of children: Not on file   Years of education: Not on file   Highest education level: Doctorate  Occupational History   Not on file  Tobacco Use   Smoking status: Never   Smokeless tobacco: Never   Tobacco comments:  N/A  Vaping Use   Vaping status: Never Used  Substance and Sexual Activity   Alcohol use: No   Drug use: No   Sexual activity: Yes    Partners: Male    Birth control/protection: Post-menopausal    Comment: Junel  Other Topics Concern   Not on file  Social History Narrative   Not on file   Social Determinants of Health   Financial Resource Strain: Low Risk  (08/02/2023)   Overall Financial Resource Strain (CARDIA)    Difficulty of Paying Living Expenses: Not very hard  Food Insecurity: No Food Insecurity (08/02/2023)   Hunger Vital Sign    Worried About Running Out of Food in the Last Year: Never true    Ran Out of Food in the Last Year: Never true  Transportation Needs: No Transportation Needs  (08/02/2023)   PRAPARE - Administrator, Civil Service (Medical): No    Lack of Transportation (Non-Medical): No  Physical Activity: Sufficiently Active (08/02/2023)   Exercise Vital Sign    Days of Exercise per Week: 6 days    Minutes of Exercise per Session: 60 min  Stress: No Stress Concern Present (08/02/2023)   [employer] of Occupational Health - Occupational Stress Questionnaire    Feeling of Stress : Only a little  Social Connections: Moderately Integrated (08/02/2023)   Social Connection and Isolation Panel [NHANES]    Frequency of Communication with Friends and Family: Twice a week    Frequency of Social Gatherings with Friends and Family: Once a week    Attends Religious Services: Never    Database administrator or Organizations: Yes    Attends Engineer, structural: 1 to 4 times per year    Marital Status: Married  Catering manager Violence: Not on file   Family History  Problem Relation Age of Onset   Hyperlipidemia Father    Colon polyps Father    Diverticulosis Father        has had bowel resection for diverticulitis   Cancer Father    Osteoporosis Mother    Hypothyroidism Mother    Osteoporosis Maternal Grandmother    Current Outpatient Medications on File Prior to Visit  Medication Sig   atorvastatin (LIPITOR) 40 MG tablet Take 1 tablet (40 mg total) by mouth daily.   Bacillus Coagulans-Inulin (PROBIOTIC) 1-250 BILLION-MG CAPS    buPROPion (WELLBUTRIN SR) 100 MG 12 hr tablet Take 1 tablet (100 mg total) by mouth every morning.   calcium carbonate (OS-CAL) 600 MG TABS Take 600 mg by mouth every evening.    cetirizine (ZYRTEC) 10 MG tablet Take 1 tablet (10 mg total) by mouth daily. (Patient taking differently: Take 10 mg by mouth every morning.)   DULoxetine (CYMBALTA) 30 MG capsule Take 30 mg by mouth every morning.    estradiol (ESTRACE) 2 MG tablet Take 2 mg by mouth daily.   ezetimibe (ZETIA) 10 MG tablet Take 1 tablet (10 mg total) by  mouth daily.   levothyroxine (SYNTHROID) 50 MCG tablet Take 1 tablet by mouth daily before breakfast.   Melatonin 3 MG TABS Take 3 mg by mouth at bedtime as needed (for sleep.).    Multiple Vitamins-Minerals (MULTIVITAMIN ADULTS PO) Take by mouth.   progesterone (PROMETRIUM) 200 MG capsule Take 200 mg by mouth daily.   triamcinolone ointment (KENALOG) 0.5 %    No current facility-administered medications on file prior to visit.    Review of Systems Per HPI unless specifically indicated  above     Objective:    BP 112/76   Pulse (!) 102   Ht 5' 5.75" (1.67 m)   Wt 163 lb (73.9 kg)   PF 98 L/min   BMI 26.51 kg/m   Wt Readings from Last 3 Encounters:  08/03/23 163 lb (73.9 kg)  09/29/22 169 lb (76.7 kg)  07/09/22 165 lb 12.8 oz (75.2 kg)    Physical Exam Results for orders placed or performed in visit on 07/27/23  CBC with Differential/Platelet  Result Value Ref Range   WBC 4.7 3.8 - 10.8 Thousand/uL   RBC 5.41 (H) 3.80 - 5.10 Million/uL   Hemoglobin 15.6 (H) 11.7 - 15.5 g/dL   HCT 16.1 (H) 09.6 - 04.5 %   MCV 87.6 80.0 - 100.0 fL   MCH 28.8 27.0 - 33.0 pg   MCHC 32.9 32.0 - 36.0 g/dL   RDW 40.9 81.1 - 91.4 %   Platelets 192 140 - 400 Thousand/uL   MPV 12.1 7.5 - 12.5 fL   Neutro Abs 2,374 1,500 - 7,800 cells/uL   Lymphs Abs 1,810 850 - 3,900 cells/uL   Absolute Monocytes 395 200 - 950 cells/uL   Eosinophils Absolute 80 15 - 500 cells/uL   Basophils Absolute 42 0 - 200 cells/uL   Neutrophils Relative % 50.5 %   Total Lymphocyte 38.5 %   Monocytes Relative 8.4 %   Eosinophils Relative 1.7 %   Basophils Relative 0.9 %  Comprehensive metabolic panel  Result Value Ref Range   Glucose, Bld 86 65 - 99 mg/dL   BUN 17 7 - 25 mg/dL   Creat 7.82 (H) 9.56 - 0.99 mg/dL   BUN/Creatinine Ratio 15 6 - 22 (calc)   Sodium 138 135 - 146 mmol/L   Potassium 4.3 3.5 - 5.3 mmol/L   Chloride 101 98 - 110 mmol/L   CO2 28 20 - 32 mmol/L   Calcium 9.9 8.6 - 10.2 mg/dL   Total Protein  6.6 6.1 - 8.1 g/dL   Albumin 4.6 3.6 - 5.1 g/dL   Globulin 2.0 1.9 - 3.7 g/dL (calc)   AG Ratio 2.3 1.0 - 2.5 (calc)   Total Bilirubin 1.2 0.2 - 1.2 mg/dL   Alkaline phosphatase (APISO) 69 31 - 125 U/L   AST 16 10 - 30 U/L   ALT 12 6 - 29 U/L  Lipid panel  Result Value Ref Range   Cholesterol 143 <200 mg/dL   HDL 70 > OR = 50 mg/dL   Triglycerides 63 <213 mg/dL   LDL Cholesterol (Calc) 59 mg/dL (calc)   Total CHOL/HDL Ratio 2.0 <5.0 (calc)   Non-HDL Cholesterol (Calc) 73 <086 mg/dL (calc)  Hemoglobin V7Q  Result Value Ref Range   Hgb A1c MFr Bld 5.2 <5.7 % of total Hgb   Mean Plasma Glucose 103 mg/dL   eAG (mmol/L) 5.7 mmol/L  TSH  Result Value Ref Range   TSH 1.96 mIU/L      Assessment & Plan:   Problem List Items Addressed This Visit   None Visit Diagnoses     Need for influenza vaccination    -  Primary       Updated Health Maintenance information Reviewed recent lab results with patient Encouraged improvement to lifestyle with diet and exercise \\Goal  of weight loss  ***   No orders of the defined types were placed in this encounter.     Follow up plan: No follow-ups on file.  Saralyn Pilar, DO [facility] 08/03/2023, 2:56 PM

## 2023-08-04 ENCOUNTER — Other Ambulatory Visit: Payer: Self-pay | Admitting: Family Medicine

## 2023-08-04 DIAGNOSIS — E538 Deficiency of other specified B group vitamins: Secondary | ICD-10-CM

## 2023-08-04 DIAGNOSIS — E559 Vitamin D deficiency, unspecified: Secondary | ICD-10-CM

## 2023-08-04 DIAGNOSIS — D582 Other hemoglobinopathies: Secondary | ICD-10-CM

## 2023-08-04 DIAGNOSIS — E039 Hypothyroidism, unspecified: Secondary | ICD-10-CM

## 2023-08-05 ENCOUNTER — Other Ambulatory Visit: Payer: BC Managed Care – PPO

## 2023-08-05 DIAGNOSIS — D582 Other hemoglobinopathies: Secondary | ICD-10-CM | POA: Diagnosis not present

## 2023-08-05 DIAGNOSIS — E039 Hypothyroidism, unspecified: Secondary | ICD-10-CM

## 2023-08-05 DIAGNOSIS — E538 Deficiency of other specified B group vitamins: Secondary | ICD-10-CM

## 2023-08-05 DIAGNOSIS — E559 Vitamin D deficiency, unspecified: Secondary | ICD-10-CM

## 2023-08-06 LAB — T4, FREE: Free T4: 1 ng/dL (ref 0.8–1.8)

## 2023-08-06 LAB — IRON,TIBC AND FERRITIN PANEL
%SAT: 28 % (ref 16–45)
Ferritin: 42 ng/mL (ref 16–232)
Iron: 85 ug/dL (ref 40–190)
TIBC: 300 ug/dL (ref 250–450)

## 2023-08-06 LAB — VITAMIN B12: Vitamin B-12: 335 pg/mL (ref 200–1100)

## 2023-08-06 LAB — VITAMIN D 25 HYDROXY (VIT D DEFICIENCY, FRACTURES): Vit D, 25-Hydroxy: 17 ng/mL — ABNORMAL LOW (ref 30–100)

## 2023-09-17 ENCOUNTER — Other Ambulatory Visit (HOSPITAL_BASED_OUTPATIENT_CLINIC_OR_DEPARTMENT_OTHER): Payer: Self-pay | Admitting: Internal Medicine

## 2023-09-17 DIAGNOSIS — E7800 Pure hypercholesterolemia, unspecified: Secondary | ICD-10-CM

## 2023-10-06 DIAGNOSIS — Z1231 Encounter for screening mammogram for malignant neoplasm of breast: Secondary | ICD-10-CM | POA: Diagnosis not present

## 2023-10-06 LAB — HM MAMMOGRAPHY

## 2023-10-28 DIAGNOSIS — E7849 Other hyperlipidemia: Secondary | ICD-10-CM | POA: Diagnosis not present

## 2023-10-29 LAB — NMR, LIPOPROFILE
Cholesterol, Total: 149 mg/dL (ref 100–199)
HDL Particle Number: 40.9 umol/L (ref >=30.5)
HDL-C: 79 mg/dL (ref >39)
LDL Particle Number: 410 nmol/L (ref <1000)
LDL Size: 21 nmol (ref >20.5)
LDL-C (NIH Calc): 59 mg/dL (ref 0–99)
LP-IR Score: 27 (ref <=45)
Small LDL Particle Number: <90 nmol/L (ref <=527)
Triglycerides: 53 mg/dL (ref 0–149)

## 2023-11-04 ENCOUNTER — Encounter: Payer: Self-pay | Admitting: Internal Medicine

## 2023-11-04 ENCOUNTER — Ambulatory Visit: Payer: BC Managed Care – PPO | Attending: Internal Medicine | Admitting: Internal Medicine

## 2023-11-04 VITALS — HR 65 | Ht 67.0 in | Wt 159.0 lb

## 2023-11-04 DIAGNOSIS — E7849 Other hyperlipidemia: Secondary | ICD-10-CM

## 2023-11-04 DIAGNOSIS — E7800 Pure hypercholesterolemia, unspecified: Secondary | ICD-10-CM | POA: Diagnosis not present

## 2023-11-04 MED ORDER — ATORVASTATIN CALCIUM 40 MG PO TABS: 40.0000 mg | ORAL_TABLET | Freq: Every day | ORAL | 3 refills | Status: DC

## 2023-11-04 MED ORDER — EZETIMIBE 10 MG PO TABS: 10.0000 mg | ORAL_TABLET | Freq: Every day | ORAL | 3 refills | Status: DC

## 2023-11-04 NOTE — Patient Instructions (Signed)
 Medication Instructions:  NO CHANGES  *If you need a refill on your cardiac medications before your next appointment, please call your pharmacy*   Lab Work: FASTING NMR lipoprofile in 1 year    Follow-Up: At [facility], you and your health needs are our priority.  As part of our continuing mission to provide you with exceptional heart care, we have created designated Provider Care Teams.  These Care Teams include your primary Cardiologist (physician) and Advanced Practice Providers (APPs -  Physician Assistants and Nurse Practitioners) who all work together to provide you with the care you need, when you need it.  We recommend signing up for the patient portal called MyChart.  Sign up information is provided on this After Visit Summary.  MyChart is used to connect with patients for Virtual Visits (Telemedicine).  Patients are able to view lab/test results, encounter notes, upcoming appointments, etc.  Non-urgent messages can be sent to your provider as well.   To learn more about what you can do with MyChart, go to [email or web address].    Your next appointment:    12 months with Dr. Mona

## 2023-11-04 NOTE — Progress Notes (Signed)
 Virtual Visit via Video Note   Because of Kayla Figueroa's co-morbid illnesses, she is at least at moderate risk for complications without adequate follow up.  This format is felt to be most appropriate for this patient at this time.  All issues noted in this document were discussed and addressed.  A limited physical exam was performed with this format.  Please refer to the patient's chart for her consent to telehealth for [facility].      Date:  11/04/2023   ID:  Kayla Figueroa, DOB [birth year], MRN [id number] The patient was identified using 2 identifiers.  Evaluation Performed:  Follow-Up Visit  Patient Location:  [address] [place] [address]  Provider location:   [address] [place], [place] [address]  PCP:  Kayla Marsa PARAS, DO  Cardiologist:  None Electrophysiologist:  None   Chief Complaint:  Follow-up dyslipidemia  History of Present Illness:    Kayla Figueroa is a 43 y.o. female who presents via audio/video conferencing for a telehealth visit today.  This is a pleasant female kindly referred for evaluation management of familial hyperlipidemia.  She has recently had very high cholesterol despite a near 50 pound weight loss and changes in her diet and activity.  Lipid testing about 8 months ago showed total cholesterol 313, HDL 70, triglycerides 98 and LDL 220.  She was also found to be hypothyroid and started on treatment.  Recent testing more than 6 months later has shown improvement in her lipids with total cholesterol 260, HDL 57, triglycerides 133 and LDL 176.  She does report high cholesterol in her father but no noted early onset heart disease.  She has no history of early onset heart disease.  She has no traditional cardiac risk factors such as hypertension, diabetes, smoking, etc.  She works for [employer] in [place] but had previously achieved a PhD in microbiology at [employer].   06/23/2022   Kayla Figueroa returns  today for follow-up of familial hyperlipidemia.  She underwent blood work which fortunately showed a negative LP(a).  Her lipids have come down significantly on therapy.  Her LDL particle number is now 1004 and 39, LDL-C100, HDL 57 and triglycerides 101.  She reports tolerating atorvastatin  40 mg daily without any issues.  She has noted some recent fatigue and feels that she might be undertreated with regards to her thyroid .  This could be playing a role in her cholesterol.  Would like to speak with her primary care provider first to have that retested before considering potentially other therapies.  We did do genetic testing.  This demonstrated an APO B and APO A5 mutations which were variants of unknown significance.   09/29/2022   Kayla Figueroa is seen today in follow-up.  She is doing extremely well on combination therapy with atorvastatin  and ezetimibe .  Her LDL particle numbers have come down even further with and LDL-P of 792, total cholesterol 79, HDL 69, triglycerides 66 and small LDL particle number of 343.  All of her numbers are now within normal limits.  She is tolerating the combination therapy well.  11/04/2023  Kayla Figueroa is seen today via virtual visit for follow-up.  She is doing very well.  Recently she was discovered to have a low vitamin D  level of 17.  She is now on replacement for that.  Repeat testing is not yet been performed.  She did have a repeat lipid NMR which showed a particle number of 410 (down  from 792), LDL of 59, HDL 79 and triglycerides 53.  Her small LDL particle number is extremely low less than 90.  She eats a healthy diet and exercises regularly.  She has no side effects from her medications.  Overall she is doing very well.  Prior CV studies:   The following studies were reviewed today:  Chart reviewed, lab work  PMHx:  Past Medical History:  Diagnosis Date   Allergy    Anxiety    Chronic fatigue    Depression    GERD (gastroesophageal reflux disease)     RARE   Heart murmur    present in high school--no longer present   Hyperlipidemia 2022   Thyroid  disease 2022    Past Surgical History:  Procedure Laterality Date   HYSTEROSCOPY WITH D & C N/A 04/24/2018   Procedure: DILATATION AND CURETTAGE /HYSTEROSCOPY;  Surgeon: Ward, Mitzie BROCKS, MD;  Location: [facility] ORS;  Service: Gynecology;  Laterality: N/A;   TONSILLECTOMY AND ADENOIDECTOMY  10/26/2003   as an adult    FAMHx:  Family History  Problem Relation Age of Onset   Hyperlipidemia Father    Colon polyps Father    Diverticulosis Father        has had bowel resection for diverticulitis   Cancer Father    Osteoporosis Mother    Hypothyroidism Mother    Osteoporosis Maternal Grandmother     SOCHx:   reports that she has never smoked. She has never used smokeless tobacco. She reports that she does not drink alcohol and does not use drugs.  ALLERGIES:  Allergies  Allergen Reactions   Mangifera Indica Fruit Ext (Mango) [Mangifera Indica]    Sulfa Antibiotics Hives    MEDS:  Current Meds  Medication Sig   atorvastatin  (LIPITOR) 40 MG tablet Take 1 tablet by mouth daily.   Bacillus Coagulans-Inulin (PROBIOTIC) 1-250 BILLION-MG CAPS    buPROPion  (WELLBUTRIN  SR) 100 MG 12 hr tablet Take 1 tablet (100 mg total) by mouth every morning.   calcium  carbonate (OS-CAL) 600 MG TABS Take 600 mg by mouth every evening.    cetirizine  (ZYRTEC ) 10 MG tablet Take 1 tablet (10 mg total) by mouth daily. (Patient taking differently: Take 10 mg by mouth every morning.)   DULoxetine  (CYMBALTA ) 30 MG capsule Take 30 mg by mouth every morning.    estradiol  (ESTRACE ) 2 MG tablet Take 2 mg by mouth daily.   ezetimibe  (ZETIA ) 10 MG tablet Take 1 tablet (10 mg total) by mouth daily.   levothyroxine  (SYNTHROID ) 50 MCG tablet Take 1 tablet by mouth daily before breakfast.   Melatonin 3 MG TABS Take 3 mg by mouth at bedtime as needed (for sleep.).    Multiple Vitamins-Minerals (MULTIVITAMIN ADULTS PO)  Take by mouth.   progesterone  (PROMETRIUM ) 100 MG capsule Take 100 mg by mouth daily.   triamcinolone ointment (KENALOG) 0.5 %      ROS: Pertinent items noted in HPI and remainder of comprehensive ROS otherwise negative.  Labs/Other Tests and Data Reviewed:    Recent Labs: 07/27/2023: ALT 12; BUN 17; Creat 1.11; Hemoglobin 15.6; Platelets 192; Potassium 4.3; Sodium 138; TSH 1.96   Recent Lipid Panel Lab Results  Component Value Date/Time   CHOL 143 07/27/2023 08:39 AM   TRIG 63 07/27/2023 08:39 AM   HDL 70 07/27/2023 08:39 AM   CHOLHDL 2.0 07/27/2023 08:39 AM   LDLCALC 59 07/27/2023 08:39 AM   LDLDIRECT 195 (H) 11/25/2021 08:09 AM    Wt Readings from  Last 3 Encounters:  11/04/23 159 lb (72.1 kg)  08/03/23 163 lb (73.9 kg)  09/29/22 169 lb (76.7 kg)     Exam:    Vital Signs:  Pulse 65   Ht 5' 7 (1.702 m)   Wt 159 lb (72.1 kg)   BMI 24.90 kg/m    General appearance: alert and no distress Lungs: No visual respiratory difficulty Abdomen: Normal weight Extremities: extremities normal, atraumatic, no cyanosis or edema Neurologic: Grossly normal  ASSESSMENT & PLAN:    Familial hyperlipidemia, 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  In addition she is eating a healthy diet and exercises regularly.  She has further improved her lipids probably mostly with lifestyle modifications although recently was found to have a vitamin D  deficiency.  She is now on therapy for that.  Overall her cholesterol is excellent at this point.  I would recommend no changes.  We might consider a calcium  score may be at age 60 to risk ratified her further however it is unlikely to change her therapy.  Plan follow-up with me annually or sooner as necessary.  Patient Risk:   After full review of this patients clinical status, I feel that they are at least moderate  risk at this time.  Time:   Today, I have spent 15 minutes with the patient with telehealth technology discussing dyslipidemia.     Medication Adjustments/Labs and Tests Ordered: Current medicines are reviewed at length with the patient today.  Concerns regarding medicines are outlined above.   Tests Ordered: No orders of the defined types were placed in this encounter.   Medication Changes: No orders of the defined types were placed in this encounter.   Disposition:  in 1 year(s)  Kayla KYM Maxcy, MD, [facility], FACP  [place]  [facility]  Medical Director of the Advanced Lipid Disorders &  Cardiovascular Risk Reduction Clinic Diplomate of the American Board of Clinical Lipidology Attending Cardiologist  Direct Dial: [phone]  Fax: [phone]  Website:  [email or web address]  Kayla JAYSON Maxcy, MD  11/04/2023 8:29 AM

## 2023-12-18 ENCOUNTER — Encounter: Payer: Self-pay | Admitting: Family Medicine

## 2024-02-14 ENCOUNTER — Encounter: Payer: Self-pay | Admitting: Family Medicine

## 2024-02-14 DIAGNOSIS — E039 Hypothyroidism, unspecified: Secondary | ICD-10-CM

## 2024-02-14 MED ORDER — LEVOTHYROXINE SODIUM 75 MCG PO TABS
75.0000 ug | ORAL_TABLET | Freq: Every day | ORAL | 1 refills | Status: AC
Start: 2024-02-14 — End: ?

## 2024-04-23 DIAGNOSIS — Z01419 Encounter for gynecological examination (general) (routine) without abnormal findings: Secondary | ICD-10-CM | POA: Diagnosis not present

## 2024-04-23 DIAGNOSIS — E2839 Other primary ovarian failure: Secondary | ICD-10-CM | POA: Diagnosis not present

## 2024-04-23 LAB — HM PAP SMEAR: HM Pap smear: NEGATIVE

## 2024-05-09 DIAGNOSIS — Z780 Asymptomatic menopausal state: Secondary | ICD-10-CM | POA: Diagnosis not present

## 2024-05-15 ENCOUNTER — Encounter (INDEPENDENT_AMBULATORY_CARE_PROVIDER_SITE_OTHER): Payer: Self-pay | Admitting: Family Medicine

## 2024-05-15 DIAGNOSIS — F411 Generalized anxiety disorder: Secondary | ICD-10-CM | POA: Diagnosis not present

## 2024-05-15 MED ORDER — BUPROPION HCL ER (SR) 100 MG PO TB12
100.0000 mg | ORAL_TABLET | ORAL | 1 refills | Status: AC
Start: 2024-05-15 — End: ?

## 2024-05-15 MED ORDER — DULOXETINE HCL 20 MG PO CPEP
20.0000 mg | ORAL_CAPSULE | Freq: Every day | ORAL | 1 refills | Status: AC
Start: 2024-05-15 — End: ?

## 2024-05-15 NOTE — Telephone Encounter (Signed)
Please see the MyChart message reply(ies) for my assessment and plan.    This patient gave consent for this Medical Advice Message and is aware that it may result in a bill to their insurance company, as well as the possibility of receiving a bill for a co-payment or deductible. They are an established patient, but are not seeking medical advice exclusively about a problem treated during an in person or video visit in the last seven days. I did not recommend an in person or video visit within seven days of my reply.    I spent a total of 5 minutes cumulative time within 7 days through MyChart messaging.  Axelle Szwed, DO   

## 2024-05-30 ENCOUNTER — Encounter: Payer: Self-pay | Admitting: Family Medicine

## 2024-06-01 ENCOUNTER — Telehealth: Admitting: Family Medicine

## 2024-06-01 ENCOUNTER — Encounter: Payer: Self-pay | Admitting: Family Medicine

## 2024-06-01 DIAGNOSIS — U071 COVID-19: Secondary | ICD-10-CM

## 2024-06-01 DIAGNOSIS — J069 Acute upper respiratory infection, unspecified: Secondary | ICD-10-CM | POA: Diagnosis not present

## 2024-06-01 NOTE — Patient Instructions (Addendum)

## 2024-06-01 NOTE — Progress Notes (Signed)
 Subjective:    Patient ID: Kayla Figueroa, female    DOB: [birth year], 43 y.o.   MRN: [id number]  Kayla Figueroa is a 43 y.o. female presenting on 06/01/2024 for Covid Positive   Virtual / Telehealth Encounter - Video Visit via MyChart The purpose of this virtual visit is to provide medical care while limiting exposure to the novel coronavirus (COVID19) for both patient and office staff.  Consent was obtained for remote visit:  Yes.   Answered questions that patient had about telehealth interaction:  Yes.   I discussed the limitations, risks, security and privacy concerns of performing an evaluation and management service by video/telephone. I also discussed with the patient that there may be a patient responsible charge related to this service. The patient expressed understanding and agreed to proceed.  Patient Location: Home Provider Location: [facility] (Office)  Participants in virtual visit: - Patient: Kayla Figueroa - CMA: Alan Fontana CMA - Provider: Dr Edman   HPI  Discussed the use of AI scribe software for clinical note transcription with the patient, who gave verbal consent to proceed.  History of Present Illness   Kayla Figueroa is a 43 year old female who presents with symptoms following a positive COVID-19 test.  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 She drove, no air travel - Fever began on Tuesday with a temperature of 99.46F, rising to 102F by Tuesday evening and persisting at 101-102F on Wednesday - Fever has decreased as of today - Poor sleep on Tuesday night - No one else at her workplace is sick and her husband is asymptomatic  Upper respiratory symptoms - Sore throat developed on Tuesday - No prior history of COVID-19 infection - No shortness of breath or wheezing  She has prior COVID vaccinations, including  last year.  Lower respiratory symptoms - Mild cough developed as fever decreased and symptoms moved into her chest  Gastrointestinal and neurological symptoms - Nausea and dizziness occurred on Wednesday, attributed to high fever - Nausea and dizziness have since resolved  Relevant past viral illness - In 2013, experienced a viral illness with similar symptoms resulting in chronic fatigue lasting about seven months     Tried DayQuil and NyQuil AS NEEDED      08/03/2023    3:03 PM 07/09/2022   10:27 AM 05/26/2021    1:58 PM  Depression screen PHQ 2/9  Decreased Interest 0 0 0  Down, Depressed, Hopeless 0 0 0  PHQ - 2 Score 0 0 0  Altered sleeping 3 1 3   Tired, decreased energy 0 2 1  Change in appetite 0 0 0  Feeling bad or failure about yourself  0 0 0  Trouble concentrating 0 0 0  Moving slowly or fidgety/restless 0 0 0  Suicidal thoughts 0 0 0  PHQ-9 Score 3 3 4   Difficult doing work/chores Somewhat difficult Not difficult at all Not difficult at all       08/03/2023    3:04 PM 07/09/2022   10:27 AM 05/26/2021    1:45 PM 06/27/2020    6:53 PM  GAD 7 : Generalized Anxiety Score  Nervous, Anxious, on Edge 0 0 0 0  Control/stop worrying 0 0 0 0  Worry too much - different things 0 0 0 0  Trouble relaxing 0 0 0 1  Restless 0 0 0 0  Easily annoyed or irritable  0 0 0 0  Afraid - awful might happen 0 0 0 0  Total GAD 7 Score 0 0 0 1  Anxiety Difficulty  Not difficult at all Not difficult at all Not difficult at all    Social History   Tobacco Use   Smoking status: Never   Smokeless tobacco: Never   Tobacco comments:    N/A  Vaping Use   Vaping status: Never Used  Substance Use Topics   Alcohol use: No   Drug use: No    Review of Systems Per HPI unless specifically indicated above     Objective:    There were no vitals taken for this visit.  Wt Readings from Last 3 Encounters:  11/04/23 159 lb (72.1 kg)  08/03/23 163 lb (73.9 kg)  09/29/22 169 lb (76.7 kg)      Physical Exam  Note examination was completely remotely via video observation objective data only  Gen - well-appearing but tired, no acute distress or apparent pain, comfortable HEENT - eyes appear clear without discharge or redness Heart/Lungs - cannot examine virtually - observed no evidence of coughing or labored breathing. Abd - cannot examine virtually  Skin - face visible today- no rash Neuro - awake, alert, oriented Psych - not anxious appearing   Results for orders placed or performed in visit on 08/05/23  Iron, TIBC and Ferritin Panel   Collection Time: 08/05/23  9:24 AM  Result Value Ref Range   Iron 85 40 - 190 mcg/dL   TIBC 699 749 - 549 mcg/dL (calc)   %SAT 28 16 - 45 % (calc)   Ferritin 42 16 - 232 ng/mL  T4, free   Collection Time: 08/05/23  9:24 AM  Result Value Ref Range   Free T4 1.0 0.8 - 1.8 ng/dL  Vitamin B12   Collection Time: 08/05/23  9:24 AM  Result Value Ref Range   Vitamin B-12 335 200 - 1,100 pg/mL  VITAMIN D  25 Hydroxy (Vit-D Deficiency, Fractures)   Collection Time: 08/05/23  9:24 AM  Result Value Ref Range   Vit D, 25-Hydroxy 17 (L) 30 - 100 ng/mL      Assessment & Plan:   Problem List Items Addressed This Visit   None Visit Diagnoses       COVID-19    -  Primary     Viral URI            COVID-19 infection Confirmed COVID-19 with mild symptoms. No major complications anticipated. Discussed uncertain risk of long COVID, noting decreased incidence of complicated cases. Paxlovid not indicated. - Continue DayQuil or NightQuil for fever and discomfort. - Encourage rest and hydration. - Monitor for worsening symptoms, especially respiratory issues or recurrent fever. - Consider pulmonology or [facility] clinic referral for long COVID concerns if symptoms persist or worsen.        No orders of the defined types were placed in this encounter.   No orders of the defined types were placed in this encounter.   Follow up plan: Return  if symptoms worsen or fail to improve.   Patient verbalizes understanding with the above medical recommendations including the limitation of remote medical advice.  Specific follow-up and call-back criteria were given for patient to follow-up or seek medical care more urgently if needed.  Total duration of direct patient care provided via video conference: 12 minutes   Marsa Officer, DO [facility] 06/01/2024, 1:28 PM

## 2024-06-19 ENCOUNTER — Ambulatory Visit

## 2024-06-19 ENCOUNTER — Telehealth: Payer: Self-pay

## 2024-06-19 VITALS — BP 110/70 | HR 98 | Ht 67.0 in | Wt 158.1 lb

## 2024-06-19 DIAGNOSIS — H6123 Impacted cerumen, bilateral: Secondary | ICD-10-CM | POA: Insufficient documentation

## 2024-06-19 DIAGNOSIS — U099 Post covid-19 condition, unspecified: Secondary | ICD-10-CM | POA: Insufficient documentation

## 2024-06-19 MED ORDER — FLUTICASONE PROPIONATE 50 MCG/ACT NA SUSP: 2.0000 | Freq: Every day | NASAL | 3 refills | Status: DC

## 2024-06-19 NOTE — Progress Notes (Signed)
 Acute Patient Visit  Physician: Brielle Moro A Saleemah Mollenhauer, MD  Patient: Kayla Figueroa MRN: [id number] DOB: [birth year] PCP: Edman Marsa PARAS, DO     Subjective:   Chief Complaint  Patient presents with   Follow-up    Still having some symptoms of COVID haven't retested     HPI: The patient is a 43 y.o. female who presents today for:   Discussed the use of AI scribe software for clinical note transcription with the patient, who gave verbal consent to proceed.  History of Present Illness   Kayla Figueroa is a 43 year old female with a history of chronic fatigue syndrome who presents with persistent symptoms following a recent COVID-19 infection.  Post-acute covid-19 symptoms - Tested positive for COVID-19 earlier in August 2025, with initial symptoms beginning August 5th - Initial symptoms: high fever, sore throat, myalgias, nasal congestion, and headache during the first week - Second week: cough, persistent sore throat, increased congestion, ear problems, fatigue, and continued headaches - Third week: persistent headache, fatigue, intermittent fever, ongoing ear congestion, and sinus symptoms  - No fevers - No loss of smell, shortness of breath, or chest pain - Occasional chest sensation associated with cough  Fatigue and sleep disturbance - Significant fatigue, described as 'hard to get out of bed tired' - Able to continue working but requires naps after work - Difficulty engaging in usual activities due to fatigue - Difficulty sleeping despite fatigue, with restless sleep  Ear and sinus congestion - Ongoing ear congestion and sinus symptoms since second week of illness - Ear congestion is a concern due to upcoming travel - Attempts to clean ears have been unsuccessful  Thyroid  disease - History of hypothyroidism - Last thyroid  function test in October 2024; due for recheck  Immunization status - COVID-19 vaccination up to date, last dose in September  2024 - Typically receives annual influenza vaccination but has not yet received it this year          ROS:   As noted in the HPI    ASSESMENT/PLAN:  Encounter Diagnoses  Name Primary?   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   Assessment and Plan    Acute COVID-19 infection with persistent symptoms Persistent symptoms following acute COVID-19 infection, including fatigue.  Symptoms align with post-viral syndrome, common in COVID-19 cases. Discussed prolonged recovery and potential for long COVID, though it is too early to determine. Emphasized rest, nutrition, hydration, and gradual return to activities. Prolonged symptoms are common, and recovery may take several months. - Advise rest and gradual return to normal activities. - Encourage optimal nutrition and hydration. - Recommend sunlight exposure. - Prescribe fluticasone  nasal spray for congestion relief. - Seek medical attention if fever exceeds 100F or symptoms worsen.  Bilateral cerumen impaction Complete cerumen impaction in both ears, contributing to congestion and potential discomfort, especially with upcoming air travel. Both ears are completely blocked, which may exacerbate symptoms. - Recommend wax removal drops or mineral oil to soften earwax.  Hypothyroidism last evaluated in October 2024. Re-evaluation is due, but advised to delay thyroid  function tests until post-COVID recovery to avoid skewed results. Post-viral states can affect thyroid  function tests. - Schedule thyroid  function tests for October 2025          OBJECTIVE: Vitals:   06/19/24 0954  BP: 110/70  Pulse: 98  SpO2: 99%  Weight: 158 lb 2 oz (71.7 kg)  Height: 5' 7 (  1.702 m)    Body mass index is 24.77 kg/m.   Physical Exam Vitals reviewed.  Constitutional:      Appearance: Normal appearance. Well-developed with normal weight.  Cardiovascular:     Rate and Rhythm: Normal rate  and regular rhythm. Normal heart sounds. Normal peripheral pulses Pulmonary:     Normal breath sounds with normal effort Skin:    General: Skin is warm and dry without noticeable rash. Neurological:     General: No focal deficit present.  Psychiatric:        Mood and Affect: Mood, behavior and cognition normal       Allergies Patient is allergic to mangifera indica fruit ext (mango) [mangifera indica] and sulfa antibiotics.  Past Medical History Patient  has a past medical history of Allergy, Anxiety, Chronic fatigue, Depression, GERD (gastroesophageal reflux disease), Heart murmur, Hyperlipidemia (2022), and Thyroid  disease (2022).  Surgical History Patient  has a past surgical history that includes Tonsillectomy and adenoidectomy (10/26/2003) and Hysteroscopy with D & C (N/A, 04/24/2018).  Family History Pateint's family history includes Cancer in her father; Colon polyps in her father; Diverticulosis in her father; Hyperlipidemia in her father; Hypothyroidism in her mother; Osteoporosis in her maternal grandmother and mother.  Social History Patient  reports that she has never smoked. She has never used smokeless tobacco. She reports that she does not drink alcohol and does not use drugs.    06/19/2024

## 2024-06-19 NOTE — Telephone Encounter (Signed)
 Copied from CRM [id number]. Topic: Clinical - Prescription Issue >> Jun 19, 2024 11:53 AM Pinkey ORN wrote: Patient states her insurance will not cover fluticasone  (FLONASE ) 50 MCG/ACT nasal spray. Patient is wanting to know if there is an alternative that could be sent to the pharmacy instead. Patient is requesting a follow up on this issue.

## 2024-07-08 ENCOUNTER — Other Ambulatory Visit: Payer: Self-pay | Admitting: Family Medicine

## 2024-07-08 DIAGNOSIS — E039 Hypothyroidism, unspecified: Secondary | ICD-10-CM

## 2024-07-31 ENCOUNTER — Other Ambulatory Visit: Payer: Self-pay | Admitting: Family Medicine

## 2024-07-31 ENCOUNTER — Encounter: Payer: Self-pay | Admitting: Family Medicine

## 2024-07-31 DIAGNOSIS — E039 Hypothyroidism, unspecified: Secondary | ICD-10-CM

## 2024-07-31 DIAGNOSIS — E7849 Other hyperlipidemia: Secondary | ICD-10-CM

## 2024-07-31 DIAGNOSIS — E559 Vitamin D deficiency, unspecified: Secondary | ICD-10-CM

## 2024-07-31 DIAGNOSIS — Z0000 Encounter for general adult medical examination without abnormal findings: Secondary | ICD-10-CM

## 2024-07-31 DIAGNOSIS — F411 Generalized anxiety disorder: Secondary | ICD-10-CM

## 2024-07-31 DIAGNOSIS — E538 Deficiency of other specified B group vitamins: Secondary | ICD-10-CM

## 2024-07-31 DIAGNOSIS — D582 Other hemoglobinopathies: Secondary | ICD-10-CM

## 2024-08-03 ENCOUNTER — Other Ambulatory Visit

## 2024-08-03 DIAGNOSIS — R7309 Other abnormal glucose: Secondary | ICD-10-CM | POA: Diagnosis not present

## 2024-08-03 DIAGNOSIS — F411 Generalized anxiety disorder: Secondary | ICD-10-CM | POA: Diagnosis not present

## 2024-08-03 DIAGNOSIS — E559 Vitamin D deficiency, unspecified: Secondary | ICD-10-CM | POA: Diagnosis not present

## 2024-08-03 DIAGNOSIS — E7849 Other hyperlipidemia: Secondary | ICD-10-CM | POA: Diagnosis not present

## 2024-08-03 DIAGNOSIS — E039 Hypothyroidism, unspecified: Secondary | ICD-10-CM | POA: Diagnosis not present

## 2024-08-03 DIAGNOSIS — E538 Deficiency of other specified B group vitamins: Secondary | ICD-10-CM | POA: Diagnosis not present

## 2024-08-04 LAB — COMPREHENSIVE METABOLIC PANEL WITH GFR
AG Ratio: 2.6 (calc) — ABNORMAL HIGH (ref 1.0–2.5)
ALT: 9 U/L (ref 6–29)
AST: 12 U/L (ref 10–30)
Albumin: 4.4 g/dL (ref 3.6–5.1)
Alkaline phosphatase (APISO): 48 U/L (ref 31–125)
BUN: 10 mg/dL (ref 7–25)
CO2: 29 mmol/L (ref 20–32)
Calcium: 8.9 mg/dL (ref 8.6–10.2)
Chloride: 106 mmol/L (ref 98–110)
Creat: 0.84 mg/dL (ref 0.50–0.99)
Globulin: 1.7 g/dL — ABNORMAL LOW (ref 1.9–3.7)
Glucose, Bld: 86 mg/dL (ref 65–99)
Potassium: 4 mmol/L (ref 3.5–5.3)
Sodium: 141 mmol/L (ref 135–146)
Total Bilirubin: 0.8 mg/dL (ref 0.2–1.2)
Total Protein: 6.1 g/dL (ref 6.1–8.1)
eGFR: 88 mL/min/1.73m2 (ref >=60)

## 2024-08-04 LAB — CBC WITH DIFFERENTIAL/PLATELET
Absolute Lymphocytes: 2030 {cells}/uL (ref 850–3900)
Absolute Monocytes: 291 {cells}/uL (ref 200–950)
Basophils Absolute: 21 {cells}/uL (ref 0–200)
Basophils Relative: 0.5 %
Eosinophils Absolute: 82 {cells}/uL (ref 15–500)
Eosinophils Relative: 2 %
HCT: 42.8 % (ref 35.0–45.0)
Hemoglobin: 14.1 g/dL (ref 11.7–15.5)
MCH: 28.5 pg (ref 27.0–33.0)
MCHC: 32.9 g/dL (ref 32.0–36.0)
MCV: 86.6 fL (ref 80.0–100.0)
MPV: 12.9 fL — ABNORMAL HIGH (ref 7.5–12.5)
Monocytes Relative: 7.1 %
Neutro Abs: 1677 {cells}/uL (ref 1500–7800)
Neutrophils Relative %: 40.9 %
Platelets: 159 Thousand/uL (ref 140–400)
RBC: 4.94 Million/uL (ref 3.80–5.10)
RDW: 12.5 % (ref 11.0–15.0)
Total Lymphocyte: 49.5 %
WBC: 4.1 Thousand/uL (ref 3.8–10.8)

## 2024-08-04 LAB — HEMOGLOBIN A1C
Hgb A1c MFr Bld: 5 % (ref <5.7)
Mean Plasma Glucose: 97 mg/dL
eAG (mmol/L): 5.4 mmol/L

## 2024-08-04 LAB — TSH: TSH: 0.31 m[IU]/L — ABNORMAL LOW

## 2024-08-04 LAB — VITAMIN D 25 HYDROXY (VIT D DEFICIENCY, FRACTURES): Vit D, 25-Hydroxy: 46 ng/mL (ref 30–100)

## 2024-08-04 LAB — LIPID PANEL
Cholesterol: 108 mg/dL (ref <200)
HDL: 56 mg/dL (ref >=50)
LDL Cholesterol (Calc): 38 mg/dL
Non-HDL Cholesterol (Calc): 52 mg/dL (ref <130)
Total CHOL/HDL Ratio: 1.9 (calc) (ref <5.0)
Triglycerides: 48 mg/dL (ref <150)

## 2024-08-04 LAB — T4, FREE: Free T4: 1.5 ng/dL (ref 0.8–1.8)

## 2024-08-04 LAB — VITAMIN B12: Vitamin B-12: 404 pg/mL (ref 200–1100)

## 2024-08-10 ENCOUNTER — Encounter: Payer: Self-pay | Admitting: Family Medicine

## 2024-08-10 ENCOUNTER — Ambulatory Visit (INDEPENDENT_AMBULATORY_CARE_PROVIDER_SITE_OTHER): Admitting: Family Medicine

## 2024-08-10 VITALS — BP 100/68 | HR 84 | Ht 67.0 in | Wt 155.4 lb

## 2024-08-10 DIAGNOSIS — F411 Generalized anxiety disorder: Secondary | ICD-10-CM

## 2024-08-10 DIAGNOSIS — E7849 Other hyperlipidemia: Secondary | ICD-10-CM

## 2024-08-10 DIAGNOSIS — Z0000 Encounter for general adult medical examination without abnormal findings: Secondary | ICD-10-CM | POA: Diagnosis not present

## 2024-08-10 DIAGNOSIS — E039 Hypothyroidism, unspecified: Secondary | ICD-10-CM | POA: Diagnosis not present

## 2024-08-10 MED ORDER — LEVOTHYROXINE SODIUM 75 MCG PO TABS: 75.0000 ug | ORAL_TABLET | Freq: Every day | ORAL | 3 refills | Status: AC

## 2024-08-10 MED ORDER — BUPROPION HCL ER (SR) 100 MG PO TB12: 100.0000 mg | ORAL_TABLET | ORAL | 3 refills | Status: AC

## 2024-08-10 NOTE — Progress Notes (Unsigned)
 Subjective:    Patient ID: Kayla Figueroa, female    DOB: 29-Oct-1980, 43 y.o.   MRN: [id number]  Kayla Figueroa is a 43 y.o. female presenting on 08/10/2024 for Annual Exam   HPI  Discussed the use of AI scribe software for clinical note transcription with the patient, who gave verbal consent to proceed.  History of Present Illness    ***   Post-COVID Fatigue Syndrome ***Seems has some fatigue if persistent activity and working out exercising  Familial HYPERLIPIDEMIA: Known fam history of hyperlipidemia Followed by Dr Mona adv lipid clinic     Hypothyroidism Last lab TSH low 0.3 and Free T4 1.5, last time inc dose Levo 50 to 75mcg she feels better Continues on Levothyroxine  75mcg daily  Fatigue, chronic She admits variety of symptoms related to thyroid  that can contribute to her symptoms often. Hypersomnia. Sensitivity to cold Nails brittle   Insomnia Usually will get 6-7 hours, 1 hour nap History of chronic fatigue since 2013   Generalized Anxiety Disorder (GAD) [facility] in [facility] on medications Wellbutrin  SR 100mg  daily and Duloxetine  30mg  daily No new concerns.  ***     Health Maintenance:   COVID19 vaccine, completed initial series and booster Pfizer 08/22/20.   Colon CA Screening: Never had colonoscopy. Currently asymptomatic except some constipation functional. Known family history of colon POLYPS (multiple - from father age 26s unsure which age precisely at this time) and no fam history of colon cancer. Due for screening test age 23+ unless father had high number polyps age 70 or earlier.   Last apt [facility]. Not due for pap year, 5 yr since 2021.   Health Maintenance:  Flu Shot and COVID Booster at pharmacy soon. She will let us  know     08/10/2024   10:35 AM 06/19/2024   10:01 AM 08/03/2023    3:03 PM  Depression screen PHQ 2/9  Decreased Interest 0 0 0  Down, Depressed, Hopeless 0 0 0  PHQ -  2 Score 0 0 0  Altered sleeping 1 0 3  Tired, decreased energy 1 1 0  Change in appetite 0 0 0  Feeling bad or failure about yourself  0 0 0  Trouble concentrating 0 0 0  Moving slowly or fidgety/restless 0 0 0  Suicidal thoughts 0 0 0  PHQ-9 Score 2 1 3   Difficult doing work/chores Not difficult at all Not difficult at all Somewhat difficult       08/10/2024   10:35 AM 06/19/2024   10:01 AM 08/03/2023    3:04 PM 07/09/2022   10:27 AM  GAD 7 : Generalized Anxiety Score  Nervous, Anxious, on Edge 0 0 0 0  Control/stop worrying 0 0 0 0  Worry too much - different things 0 0 0 0  Trouble relaxing 0 1 0 0  Restless 0 0 0 0  Easily annoyed or irritable 0 0 0 0  Afraid - awful might happen 0 0 0 0  Total GAD 7 Score 0 1 0 0  Anxiety Difficulty Not difficult at all Not difficult at all  Not difficult at all     Past Medical History:  Diagnosis Date   Allergy    Anxiety    Chronic fatigue    Depression    GERD (gastroesophageal reflux disease)    RARE   Heart murmur    present in high school--no longer present   Hyperlipidemia 2022   Thyroid  disease 2022  Past Surgical History:  Procedure Laterality Date   HYSTEROSCOPY WITH D & C N/A 04/24/2018   Procedure: DILATATION AND CURETTAGE /HYSTEROSCOPY;  Surgeon: Ward, Mitzie BROCKS, MD;  Location: [facility] ORS;  Service: Gynecology;  Laterality: N/A;   TONSILLECTOMY AND ADENOIDECTOMY  10/26/2003   as an adult   Social History   Socioeconomic History   Marital status: Married    Spouse name: Not on file   Number of children: Not on file   Years of education: Not on file   Highest education level: Doctorate  Occupational History   Not on file  Tobacco Use   Smoking status: Never   Smokeless tobacco: Never   Tobacco comments:    N/A  Vaping Use   Vaping status: Never Used  Substance and Sexual Activity   Alcohol use: No   Drug use: No   Sexual activity: Yes    Partners: Male    Birth control/protection: Post-menopausal     Comment: Junel  Other Topics Concern   Not on file  Social History Narrative   Not on file   Social Drivers of Health   Financial Resource Strain: Low Risk  (08/09/2024)   Overall Financial Resource Strain (CARDIA)    Difficulty of Paying Living Expenses: Not very hard  Food Insecurity: No Food Insecurity (08/09/2024)   Hunger Vital Sign    Worried About Running Out of Food in the Last Year: Never true    Ran Out of Food in the Last Year: Never true  Transportation Needs: No Transportation Needs (08/09/2024)   PRAPARE - Administrator, Civil Service (Medical): No    Lack of Transportation (Non-Medical): No  Physical Activity: Sufficiently Active (08/09/2024)   Exercise Vital Sign    Days of Exercise per Week: 7 days    Minutes of Exercise per Session: 60 min  Stress: No Stress Concern Present (08/09/2024)   [employer] of Occupational Health - Occupational Stress Questionnaire    Feeling of Stress: Only a little  Recent Concern: Stress - Stress Concern Present (05/31/2024)   [employer] of Occupational Health - Occupational Stress Questionnaire    Feeling of Stress: To some extent  Social Connections: Moderately Integrated (08/09/2024)   Social Connection and Isolation Panel    Frequency of Communication with Friends and Family: Three times a week    Frequency of Social Gatherings with Friends and Family: Patient declined    Attends Religious Services: Never    Database administrator or Organizations: Yes    Attends Engineer, structural: 1 to 4 times per year    Marital Status: Married  Catering manager Violence: Not on file   Family History  Problem Relation Age of Onset   Hyperlipidemia Father    Colon polyps Father    Diverticulosis Father        has had bowel resection for diverticulitis   Cancer Father    Osteoporosis Mother    Hypothyroidism Mother    Osteoporosis Maternal Grandmother    Current Outpatient Medications on File  Prior to Visit  Medication Sig   atorvastatin  (LIPITOR) 40 MG tablet Take 1 tablet (40 mg total) by mouth daily.   Bacillus Coagulans-Inulin (PROBIOTIC) 1-250 BILLION-MG CAPS    buPROPion  ER (WELLBUTRIN  SR) 100 MG 12 hr tablet Take 1 tablet (100 mg total) by mouth every morning.   calcium  carbonate (OS-CAL) 600 MG TABS Take 600 mg by mouth every evening.    cetirizine  (ZYRTEC ) 10 MG  tablet Take 1 tablet (10 mg total) by mouth daily.   DULoxetine  (CYMBALTA ) 20 MG capsule Take 1 capsule (20 mg total) by mouth daily.   estradiol  (ESTRACE ) 2 MG tablet Take 2 mg by mouth daily.   ezetimibe  (ZETIA ) 10 MG tablet Take 1 tablet (10 mg total) by mouth daily.   levothyroxine  (LEVOXYL ) 75 MCG tablet Take 1 tablet (75 mcg total) by mouth daily before breakfast.   Melatonin 3 MG TABS Take 3 mg by mouth at bedtime as needed (for sleep.).    Multiple Vitamins-Minerals (MULTIVITAMIN ADULTS PO) Take by mouth.   progesterone  (PROMETRIUM ) 100 MG capsule Take 100 mg by mouth daily.   triamcinolone ointment (KENALOG) 0.5 %    fluticasone  (FLONASE ) 50 MCG/ACT nasal spray Place 2 sprays into both nostrils daily. Use for 4-6 weeks then stop and use seasonally or as needed. Disp 1 container   No current facility-administered medications on file prior to visit.    Review of Systems Per HPI unless specifically indicated above     Objective:    BP 100/68   Pulse 84   Ht 5' 7 (1.702 m)   Wt 155 lb 6.4 oz (70.5 kg)   LMP  (Approximate)   SpO2 95%   BMI 24.34 kg/m   Wt Readings from Last 3 Encounters:  08/10/24 155 lb 6.4 oz (70.5 kg)  06/19/24 158 lb 2 oz (71.7 kg)  11/04/23 159 lb (72.1 kg)    Physical Exam   Pap with HPV Regardless Future (04/23/2024 1:22 AM EDT) Lab Results - Pap with HPV Regardless Future (04/23/2024 1:22 AM EDT) Component Value Ref Range Test Method Analysis Time Performed At Pathologist Signature  Pap with HPV Regardless Final Outreach Gynecologic Cytopathology Report  PATIENT          DAMYIAH, MOXLEY           ACCESSION #     [id number] MRN             [id number]                    PROCEDURE DATE  04/23/2024 01:22 DOB             Feb 07, 1981                 RECEIPT DATE    04/25/2024 13:29 AGE / SEX       44 Y / F                   REPORTED DATE   05/09/2024 11:25 LOCATION        [facility] Draw Station     PROVIDER(S)     PHILIPPE SAR MD   Clinical History:  LMP: No LMP provided ; Pregnant: N  Specimen:  A. THINPREP IMAGED PAP TEST WITH HPV REGARDLESS CERVICAL/ENDOCERVICAL  CYTOLOGIC DIAGNOSIS: Satisfactory for evaluation. NEGATIVE FOR INTRAEPITHELIAL LESION OR MALIGNANCY  HPV result reported below. ?  ELECTRONICALLY SIGNED BY: Leita Peng, CT (ASCP)  Related Laboratory Results:  Test Name                                      Test Result          Collection Date HPV Regardless High Risk                       Negative  04/23/2024    NOTE: The Pap test is a screening test designed to aid in the detection of premalignant and malignant conditions of the uterine cervix.  It should not be used as the sole means of detecting cervical cancer.  Both false positive and false negative results occur.  Performing Lab (unless otherwise specified):  [place], [place], [place]  [id number] [facility]  Category: GYN - NEGATIVE FOR INTRAEPIT LESION       [facility]     Lab Results - Pap with HPV Regardless Future (04/23/2024 1:22 AM EDT) Specimen (Source) Anatomical Location / Laterality Collection Method / Volume Collection Time Received Time        04/23/2024 1:22 AM EDT 04/25/2024 1:29 PM EDT    Mammo Screening Digital Tomosynthesis Bilateral  Anatomical Region Laterality Modality  Breast bilateral Mammography   Impression  IMPRESSION: No mammographic evidence of malignancy. Recommend routine screening mammography.  -Full field digital 3D mammography performed. -Exam evaluated with CAD (computer aided detection)  for increased sensitivity. -The patient is entered in a reminder system with a target due date for next mammogram. -The patient will be sent a letter concerning the results of this exam.  BI-RADS Category: 1 Negative Narrative  This result has an attachment that is not available. TOMOSYNTHESIS WITH DIGITAL SCREENING MAMMOGRAPHY  PATIENT NAME:  Shima RENEE Pingley  DATE OF SERVICE:  10/06/2023 12:20 PM  PERFORMING LOCATION: [facility] [address] [place], [place] [address] [phone]  INTERPRETING RADIOLOGIST:  VINIE MATTOCK, MD  HISTORY: Screening  COMPARISON: 09/10/2022 and 09/04/2021  TECHNIQUE: Both breasts are evaluated with 3-D imaging.  MAMMOGRAPHIC FINDINGS:  BREAST DENSITY: B - There are scattered areas of fibroglandular density. There are no suspicious masses, microcalcifications, or areas of architectural distortion. There are no secondary findings of malignancy.   Exam End: 10/06/23 12:46   Specimen Collected: 10/07/23 10:04 Last Resulted: 10/07/23 10:06  Received From: [facility]  Result Received: 08/10/24 10:04     Results for orders placed or performed in visit on 07/31/24  TSH   Collection Time: 08/03/24  8:33 AM  Result Value Ref Range   TSH 0.31 (L) mIU/L  Lipid panel   Collection Time: 08/03/24  8:33 AM  Result Value Ref Range   Cholesterol 108 <200 mg/dL   HDL 56 > OR = 50 mg/dL   Triglycerides 48 <849 mg/dL   LDL Cholesterol (Calc) 38 mg/dL (calc)   Total CHOL/HDL Ratio 1.9 <5.0 (calc)   Non-HDL Cholesterol (Calc) 52 <869 mg/dL (calc)  Hemoglobin J8r   Collection Time: 08/03/24  8:33 AM  Result Value Ref Range   Hgb A1c MFr Bld 5.0 <5.7 %   Mean Plasma Glucose 97 mg/dL   eAG (mmol/L) 5.4 mmol/L  CBC with Differential/Platelet   Collection Time: 08/03/24  8:33 AM  Result Value Ref Range   WBC 4.1 3.8 - 10.8 Thousand/uL   RBC 4.94 3.80 - 5.10 Million/uL   Hemoglobin 14.1 11.7 - 15.5 g/dL   HCT 57.1  64.9 - 54.9 %   MCV 86.6 80.0 - 100.0 fL   MCH 28.5 27.0 - 33.0 pg   MCHC 32.9 32.0 - 36.0 g/dL   RDW 87.4 88.9 - 84.9 %   Platelets 159 140 - 400 Thousand/uL   MPV 12.9 (H) 7.5 - 12.5 fL   Neutro Abs 1,677 1,500 - 7,800 cells/uL   Absolute Lymphocytes 2,030 850 - 3,900 cells/uL   Absolute Monocytes 291  200 - 950 cells/uL   Eosinophils Absolute 82 15 - 500 cells/uL   Basophils Absolute 21 0 - 200 cells/uL   Neutrophils Relative % 40.9 %   Total Lymphocyte 49.5 %   Monocytes Relative 7.1 %   Eosinophils Relative 2.0 %   Basophils Relative 0.5 %  Comprehensive metabolic panel with GFR   Collection Time: 08/03/24  8:33 AM  Result Value Ref Range   Glucose, Bld 86 65 - 99 mg/dL   BUN 10 7 - 25 mg/dL   Creat 9.15 9.49 - 9.00 mg/dL   eGFR 88 > OR = 60 fO/fpw/8.26f7   BUN/Creatinine Ratio SEE NOTE: 6 - 22 (calc)   Sodium 141 135 - 146 mmol/L   Potassium 4.0 3.5 - 5.3 mmol/L   Chloride 106 98 - 110 mmol/L   CO2 29 20 - 32 mmol/L   Calcium  8.9 8.6 - 10.2 mg/dL   Total Protein 6.1 6.1 - 8.1 g/dL   Albumin 4.4 3.6 - 5.1 g/dL   Globulin 1.7 (L) 1.9 - 3.7 g/dL (calc)   AG Ratio 2.6 (H) 1.0 - 2.5 (calc)   Total Bilirubin 0.8 0.2 - 1.2 mg/dL   Alkaline phosphatase (APISO) 48 31 - 125 U/L   AST 12 10 - 30 U/L   ALT 9 6 - 29 U/L  T4, free   Collection Time: 08/03/24  8:33 AM  Result Value Ref Range   Free T4 1.5 0.8 - 1.8 ng/dL  VITAMIN D  25 Hydroxy (Vit-D Deficiency, Fractures)   Collection Time: 08/03/24  8:33 AM  Result Value Ref Range   Vit D, 25-Hydroxy 46 30 - 100 ng/mL  Vitamin B12   Collection Time: 08/03/24  8:33 AM  Result Value Ref Range   Vitamin B-12 404 200 - 1,100 pg/mL      Assessment & Plan:   Problem List Items Addressed This Visit   None    Updated Health Maintenance information ***- Reviewed recent lab results with patient Encouraged improvement to lifestyle with diet and exercise -*** Goal of weight loss  Assessment and Plan Assessment &  Plan      No orders of the defined types were placed in this encounter.   No orders of the defined types were placed in this encounter.    Follow up plan: No follow-ups on file.  Marsa Officer, DO [facility] 08/10/2024, 10:46 AM

## 2024-08-10 NOTE — Patient Instructions (Addendum)
 Thank you for coming to the office today.  Check childhood Hep B / occupational / records  Can do titer  Or re vaccinate Heplisav B 2 doses 1 month apart  Re ordered Duloxetine , Wellbutrin  SR 100mg    Please schedule a Follow-up Appointment to: Return for 1 year fasting lab > 1 week later Annual Physical.  If you have any other questions or concerns, please feel free to call the office or send a message through MyChart. You may also schedule an earlier appointment if necessary.  Additionally, you may be receiving a survey about your experience at our office within a few days to 1 week by e-mail or mail. We value your feedback.  Marsa Officer, DO [facility], [place]

## 2024-08-22 ENCOUNTER — Encounter: Payer: Self-pay | Admitting: Family Medicine

## 2024-09-19 ENCOUNTER — Other Ambulatory Visit: Payer: Self-pay | Admitting: *Deleted

## 2024-09-19 DIAGNOSIS — E7849 Other hyperlipidemia: Secondary | ICD-10-CM

## 2024-10-09 DIAGNOSIS — Z1231 Encounter for screening mammogram for malignant neoplasm of breast: Secondary | ICD-10-CM | POA: Diagnosis not present

## 2024-10-09 LAB — HM MAMMOGRAPHY

## 2024-10-14 ENCOUNTER — Other Ambulatory Visit: Payer: Self-pay | Admitting: Internal Medicine

## 2024-10-14 DIAGNOSIS — E7800 Pure hypercholesterolemia, unspecified: Secondary | ICD-10-CM

## 2024-10-21 ENCOUNTER — Encounter: Payer: Self-pay | Admitting: Internal Medicine

## 2024-10-21 DIAGNOSIS — E7800 Pure hypercholesterolemia, unspecified: Secondary | ICD-10-CM

## 2024-10-22 MED ORDER — ATORVASTATIN CALCIUM 40 MG PO TABS: 40.0000 mg | ORAL_TABLET | Freq: Every day | ORAL | 0 refills | Status: AC

## 2024-10-22 MED ORDER — EZETIMIBE 10 MG PO TABS: 10.0000 mg | ORAL_TABLET | Freq: Every day | ORAL | 0 refills | Status: AC

## 2024-11-15 ENCOUNTER — Encounter: Payer: Self-pay | Admitting: Internal Medicine

## 2024-11-15 ENCOUNTER — Encounter: Payer: Self-pay | Admitting: Family Medicine

## 2024-11-15 ENCOUNTER — Ambulatory Visit (INDEPENDENT_AMBULATORY_CARE_PROVIDER_SITE_OTHER): Admitting: Internal Medicine

## 2024-11-15 ENCOUNTER — Ambulatory Visit: Admitting: Family Medicine

## 2024-11-15 VITALS — BP 104/68 | Ht 67.0 in | Wt 159.2 lb

## 2024-11-15 DIAGNOSIS — L710 Perioral dermatitis: Secondary | ICD-10-CM | POA: Diagnosis not present

## 2024-11-15 MED ORDER — DOXYCYCLINE HYCLATE 100 MG PO TABS: 100.0000 mg | ORAL_TABLET | Freq: Two times a day (BID) | ORAL | 1 refills | Status: AC

## 2024-11-15 MED ORDER — MUPIROCIN 2 % EX OINT: 1.0000 | TOPICAL_OINTMENT | Freq: Two times a day (BID) | CUTANEOUS | 0 refills | Status: AC

## 2024-11-15 NOTE — Progress Notes (Signed)
 "  Subjective:    Patient ID: Kayla Figueroa, female    DOB: 1980-11-03, 44 y.o.   MRN: [id number]  HPI  Discussed the use of AI scribe software for clinical note transcription with the patient, who gave verbal consent to proceed.  Kayla Figueroa is a 44 year old female who presents with a facial rash.  She developed a facial rash that began with a small red spot around Thanksgiving. Initially, she used her usual skincare routine, including retinols, but saw no improvement. She then applied a steroid cream, which temporarily improved the rash, but it returned after discontinuation. The rash is located around her mouth and occasionally flares up, becoming very red and tight, with some flaking and mild itching.  She has a history of eczema and psoriasis during high school but denies any similar lesions on her body. She has not used any new soaps or skincare products recently and has not applied any antibiotic creams to the rash. Her current skincare regimen includes a gentle cleanser, azelaic acid, and a La Roche-Posay cream, while avoiding sunscreen to limit product use.  No dental pain. There is no family history of lupus. She is allergic to sulfa and mango.      Past Medical History:  Diagnosis Date   Allergy    Anxiety    Chronic fatigue    Depression    GERD (gastroesophageal reflux disease)    RARE   Heart murmur    present in high school--no longer present   Hyperlipidemia 2022   Thyroid  disease 2022    Current Outpatient Medications  Medication Sig Dispense Refill   atorvastatin  (LIPITOR) 40 MG tablet Take 1 tablet (40 mg total) by mouth daily. 90 tablet 0   Bacillus Coagulans-Inulin (PROBIOTIC) 1-250 BILLION-MG CAPS      buPROPion  ER (WELLBUTRIN  SR) 100 MG 12 hr tablet Take 1 tablet (100 mg total) by mouth every morning. 90 tablet 3   calcium  carbonate (OS-CAL) 600 MG TABS Take 600 mg by mouth every evening.      cetirizine  (ZYRTEC ) 10 MG tablet Take 1 tablet (10 mg  total) by mouth daily. 30 tablet 11   DULoxetine  (CYMBALTA ) 20 MG capsule Take 1 capsule (20 mg total) by mouth daily. 90 capsule 3   estradiol  (ESTRACE ) 2 MG tablet Take 2 mg by mouth daily.     ezetimibe  (ZETIA ) 10 MG tablet Take 1 tablet (10 mg total) by mouth daily. 90 tablet 0   levothyroxine  (LEVOXYL ) 75 MCG tablet Take 1 tablet (75 mcg total) by mouth daily before breakfast. 90 tablet 3   Melatonin 3 MG TABS Take 3 mg by mouth at bedtime as needed (for sleep.).      Multiple Vitamins-Minerals (MULTIVITAMIN ADULTS PO) Take by mouth.     progesterone  (PROMETRIUM ) 100 MG capsule Take 100 mg by mouth daily.     triamcinolone ointment (KENALOG) 0.5 %      No current facility-administered medications for this visit.    Allergies[1]  Family History  Problem Relation Age of Onset   Hyperlipidemia Father    Colon polyps Father    Diverticulosis Father        has had bowel resection for diverticulitis   Cancer Father    Osteoporosis Mother    Hypothyroidism Mother    Osteoporosis Maternal Grandmother     Social History   Socioeconomic History   Marital status: Married    Spouse name: Not on file   Number of children: Not  on file   Years of education: Not on file   Highest education level: Doctorate  Occupational History   Not on file  Tobacco Use   Smoking status: Never   Smokeless tobacco: Never   Tobacco comments:    N/A  Vaping Use   Vaping status: Never Used  Substance and Sexual Activity   Alcohol use: No   Drug use: No   Sexual activity: Yes    Partners: Male    Birth control/protection: Post-menopausal    Comment: Junel  Other Topics Concern   Not on file  Social History Narrative   Not on file   Social Drivers of Health   Tobacco Use: Low Risk (11/15/2024)   Patient History    Smoking Tobacco Use: Never    Smokeless Tobacco Use: Never    Passive Exposure: Not on file  Financial Resource Strain: Low Risk (08/09/2024)   Overall Financial Resource  Strain (CARDIA)    Difficulty of Paying Living Expenses: Not very hard  Food Insecurity: No Food Insecurity (08/09/2024)   Epic    Worried About Programme Researcher, Broadcasting/film/video in the Last Year: Never true    Ran Out of Food in the Last Year: Never true  Transportation Needs: No Transportation Needs (08/09/2024)   Epic    Lack of Transportation (Medical): No    Lack of Transportation (Non-Medical): No  Physical Activity: Sufficiently Active (08/09/2024)   Exercise Vital Sign    Days of Exercise per Week: 7 days    Minutes of Exercise per Session: 60 min  Stress: No Stress Concern Present (08/09/2024)   [employer] of Occupational Health - Occupational Stress Questionnaire    Feeling of Stress: Only a little  Recent Concern: Stress - Stress Concern Present (05/31/2024)   [employer] of Occupational Health - Occupational Stress Questionnaire    Feeling of Stress: To some extent  Social Connections: Moderately Integrated (08/09/2024)   Social Connection and Isolation Panel    Frequency of Communication with Friends and Family: Three times a week    Frequency of Social Gatherings with Friends and Family: Patient declined    Attends Religious Services: Never    Database Administrator or Organizations: Yes    Attends Banker Meetings: 1 to 4 times per year    Marital Status: Married  Catering Manager Violence: Not on file  Depression (PHQ2-9): Low Risk (11/15/2024)   Depression (PHQ2-9)    PHQ-2 Score: 0  Alcohol Screen: Not on file  Housing: Low Risk (08/09/2024)   Epic    Unable to Pay for Housing in the Last Year: No    Number of Times Moved in the Last Year: 0    Homeless in the Last Year: No  Utilities: Not on file  Health Literacy: Not on file     Constitutional: Denies fever, malaise, fatigue, headache or abrupt weight changes.  HEENT: Denies eye pain, eye redness, ear pain, ringing in the ears, wax buildup, runny nose, nasal congestion, bloody nose, or sore  throat. Respiratory: Denies difficulty breathing, shortness of breath, cough or sputum production.   Cardiovascular: Denies chest pain, chest tightness, palpitations or swelling in the hands or feet.  Musculoskeletal: Denies decrease in range of motion, difficulty with gait, muscle pain or joint pain and swelling.  Skin: Pt reports recurrent rash of face. Denies ulcercations.  Neurological: Denies dizziness, difficulty with memory, difficulty with speech or problems with balance and coordination.    No other specific complaints  in a complete review of systems (except as listed in HPI above).  Objective    BP 104/68 (BP Location: Right Arm, Patient Position: Sitting, Cuff Size: Normal)   Ht 5' 7 (1.702 m)   Wt 159 lb 3.2 oz (72.2 kg)   LMP  (LMP Unknown)   BMI 24.93 kg/m  Wt Readings from Last 3 Encounters:  11/15/24 159 lb 3.2 oz (72.2 kg)  08/10/24 155 lb 6.4 oz (70.5 kg)  06/19/24 158 lb 2 oz (71.7 kg)    General: Appears her stated age, well developed, well nourished in NAD. Skin: Grouped maculopapular rash noted of the right chin and next to the left upper lip.  HEENT: Head: normal shape and size; Eyes: sclera white, no icterus, conjunctiva pink, PERRLA and EOMs intact;  Cardiovascular: Normal rate. Pulmonary/Chest: Normal effort. No respiratory distress. Musculoskeletal: No difficulty with gait.  Neurological: Alert and oriented.   BMET    Component Value Date/Time   NA 141 08/03/2024 0833   K 4.0 08/03/2024 0833   CL 106 08/03/2024 0833   CO2 29 08/03/2024 0833   GLUCOSE 86 08/03/2024 0833   BUN 10 08/03/2024 0833   CREATININE 0.84 08/03/2024 0833   CALCIUM  8.9 08/03/2024 0833   GFRNONAA >60 04/24/2018 1050   GFRAA >60 04/24/2018 1050    Lipid Panel     Component Value Date/Time   CHOL 108 08/03/2024 0833   TRIG 48 08/03/2024 0833   HDL 56 08/03/2024 0833   CHOLHDL 1.9 08/03/2024 0833   LDLCALC 38 08/03/2024 0833    CBC    Component Value Date/Time    WBC 4.1 08/03/2024 0833   RBC 4.94 08/03/2024 0833   HGB 14.1 08/03/2024 0833   HGB 14.9 02/12/2013 1333   HCT 42.8 08/03/2024 0833   PLT 159 08/03/2024 0833   MCV 86.6 08/03/2024 0833   MCH 28.5 08/03/2024 0833   MCHC 32.9 08/03/2024 0833   RDW 12.5 08/03/2024 0833   LYMPHSABS 1,810 07/27/2023 0839   MONOABS 0.4 04/04/2013 1547   EOSABS 82 08/03/2024 0833   BASOSABS 21 08/03/2024 0833    Hgb A1C Lab Results  Component Value Date   HGBA1C 5.0 08/03/2024       Assessment and Plan     Assessment and Plan    Perioral dermatitis Suspected perioral dermatitis exacerbated by topical steroids. Topical steroids caused rebound effects. - Prescribed topical mupirocin  ointment 2% twice daily. - Prescribed oral doxycycline   100 mg BID for 14 days with a refill, to be taken with food. - Advised use of sunscreen due to increased sun sensitivity from doxycycline . - Continue current skincare routine excluding steroid creams. - Referral to dermatologist for further evaluation      Follow-up with your PCP as previously scheduled  Angeline Laura, NP     [1]  Allergies Allergen Reactions   Mangifera Indica Fruit Ext (Mango) [Mangifera Indica]    Sulfa Antibiotics Hives   "

## 2024-11-15 NOTE — Patient Instructions (Signed)
 Perioral dermatitis  Perioral dermatitis is an eruption which is usually located around the mouth and nose.  It can be a rash and/or red bumps.  It occasionally occurs around the eyes.  It may be itchy and may burn.  The exact cause is unknown.  Some types of makeup, moisturizers, dental products, and prescription creams may be partially responsible for the eruption.  Topical steroids such as cortisone creams can temporarily make the rash better but with discontinuation the rash tends to recur and worsen.  If you have been using topical steroids, your dermatologist may need to gradually taper the strength of steroids.  Topical antibiotics, elidel cream, protopic ointment, and oral antiobiotics may be prescribed to treat this condition.  Although perioral dermatitis is not an infection, some antibiotics have anti-inflammatory properties that help it greatly.

## 2024-11-30 LAB — NMR, LIPOPROFILE
Cholesterol, Total: 133 mg/dL (ref 100–199)
HDL Particle Number: 42.1 umol/L
HDL-C: 73 mg/dL
LDL Particle Number: 466 nmol/L
LDL Size: 20.1 nm — ABNORMAL LOW
LDL-C (NIH Calc): 49 mg/dL (ref 0–99)
LP-IR Score: 25
Small LDL Particle Number: 252 nmol/L
Triglycerides: 48 mg/dL (ref 0–149)

## 2024-12-07 ENCOUNTER — Ambulatory Visit: Admitting: Internal Medicine
